# Patient Record
Sex: Female | Born: 1995 | Race: Black or African American | Hispanic: No | Marital: Single | Smoking: Never smoker
Health system: Southern US, Community
[De-identification: ages and names within clinical notes are randomized; demographics above are authoritative.]

## PROBLEM LIST (undated history)

## (undated) DIAGNOSIS — F32A Depression, unspecified: Secondary | ICD-10-CM

## (undated) DIAGNOSIS — F419 Anxiety disorder, unspecified: Secondary | ICD-10-CM

## (undated) DIAGNOSIS — G709 Myoneural disorder, unspecified: Secondary | ICD-10-CM

## (undated) DIAGNOSIS — F329 Major depressive disorder, single episode, unspecified: Secondary | ICD-10-CM

## (undated) HISTORY — DX: Anxiety disorder, unspecified: F41.9

## (undated) HISTORY — PX: WISDOM TOOTH EXTRACTION: SHX21

## (undated) HISTORY — DX: Myoneural disorder, unspecified: G70.9

## (undated) HISTORY — DX: Depression, unspecified: F32.A

## (undated) HISTORY — DX: Major depressive disorder, single episode, unspecified: F32.9

---
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
## 2017-10-01 ENCOUNTER — Encounter: Payer: Self-pay | Admitting: Nurse Practitioner

## 2018-07-29 ENCOUNTER — Encounter: Payer: Self-pay | Admitting: Nurse Practitioner

## 2018-07-29 ENCOUNTER — Ambulatory Visit: Payer: BLUE CROSS/BLUE SHIELD | Admitting: Nurse Practitioner

## 2018-07-29 VITALS — BP 110/78 | HR 86 | Temp 99.1°F | Ht 63.0 in | Wt 175.0 lb

## 2018-07-29 DIAGNOSIS — F332 Major depressive disorder, recurrent severe without psychotic features: Secondary | ICD-10-CM

## 2018-07-29 DIAGNOSIS — F419 Anxiety disorder, unspecified: Secondary | ICD-10-CM

## 2018-07-29 MED ORDER — FLUOXETINE HCL 20 MG PO TABS: 20.0000 mg | ORAL_TABLET | Freq: Every day | ORAL | 0 refills | Status: DC

## 2018-07-29 NOTE — Patient Instructions (Addendum)
Stop escitalopram. Start prozac or fluoxetine today.  Fluoxetine capsules or tablets (Depression/Mood Disorders) What is this medicine? FLUOXETINE (floo OX e teen) belongs to a class of drugs known as selective serotonin reuptake inhibitors (SSRIs). It helps to treat mood problems such as depression, obsessive compulsive disorder, and panic attacks. It can also treat certain eating disorders. This medicine may be used for other purposes; ask your health care provider or pharmacist if you have questions. COMMON BRAND NAME(S): Prozac What should I tell my health care provider before I take this medicine? They need to know if you have any of these conditions: -bipolar disorder or a family history of bipolar disorder -bleeding disorders -glaucoma -heart disease -liver disease -low levels of sodium in the blood -seizures -suicidal thoughts, plans, or attempt; a previous suicide attempt by you or a family member -take MAOIs like Carbex, Eldepryl, Marplan, Nardil, and Parnate -take medicines that treat or prevent blood clots -thyroid disease -an unusual or allergic reaction to fluoxetine, other medicines, foods, dyes, or preservatives -pregnant or trying to get pregnant -breast-feeding How should I use this medicine? Take this medicine by mouth with a glass of water. Follow the directions on the prescription label. You can take this medicine with or without food. Take your medicine at regular intervals. Do not take it more often than directed. Do not stop taking this medicine suddenly except upon the advice of your doctor. Stopping this medicine too quickly may cause serious side effects or your condition may worsen. A special MedGuide will be given to you by the pharmacist with each prescription and refill. Be sure to read this information carefully each time. Talk to your pediatrician regarding the use of this medicine in children. While this drug may be prescribed for children as young as 7  years for selected conditions, precautions do apply. Overdosage: If you think you have taken too much of this medicine contact a poison control center or emergency room at once. NOTE: This medicine is only for you. Do not share this medicine with others. What if I miss a dose? If you miss a dose, skip the missed dose and go back to your regular dosing schedule. Do not take double or extra doses. What may interact with this medicine? Do not take this medicine with any of the following medications: -other medicines containing fluoxetine, like Sarafem or Symbyax -cisapride -dronedarone -linezolid -MAOIs like Carbex, Eldepryl, Marplan, Nardil, and Parnate -methylene blue (injected into a vein) -pimozide -thioridazine This medicine may also interact with the following medications: -alcohol -amphetamines -aspirin and aspirin-like medicines -carbamazepine -certain medicines for depression, anxiety, or psychotic disturbances -certain medicines for migraine headaches like almotriptan, eletriptan, frovatriptan, naratriptan, rizatriptan, sumatriptan, zolmitriptan -digoxin -diuretics -fentanyl -flecainide -furazolidone -isoniazid -lithium -medicines for sleep -medicines that treat or prevent blood clots like warfarin, enoxaparin, and dalteparin -NSAIDs, medicines for pain and inflammation, like ibuprofen or naproxen -other medicines that prolong the QT interval (an abnormal heart rhythm) -phenytoin -procarbazine -propafenone -rasagiline -ritonavir -supplements like St. John's wort, kava kava, valerian -tramadol -tryptophan -vinblastine This list may not describe all possible interactions. Give your health care provider a list of all the medicines, herbs, non-prescription drugs, or dietary supplements you use. Also tell them if you smoke, drink alcohol, or use illegal drugs. Some items may interact with your medicine. What should I watch for while using this medicine? Tell your doctor  if your symptoms do not get better or if they get worse. Visit your doctor or health care professional for  regular checks on your progress. Because it may take several weeks to see the full effects of this medicine, it is important to continue your treatment as prescribed by your doctor. Patients and their families should watch out for new or worsening thoughts of suicide or depression. Also watch out for sudden changes in feelings such as feeling anxious, agitated, panicky, irritable, hostile, aggressive, impulsive, severely restless, overly excited and hyperactive, or not being able to sleep. If this happens, especially at the beginning of treatment or after a change in dose, call your health care professional. Bonita Quin may get drowsy or dizzy. Do not drive, use machinery, or do anything that needs mental alertness until you know how this medicine affects you. Do not stand or sit up quickly, especially if you are an older patient. This reduces the risk of dizzy or fainting spells. Alcohol may interfere with the effect of this medicine. Avoid alcoholic drinks. Your mouth may get dry. Chewing sugarless gum or sucking hard candy, and drinking plenty of water may help. Contact your doctor if the problem does not go away or is severe. This medicine may affect blood sugar levels. If you have diabetes, check with your doctor or health care professional before you change your diet or the dose of your diabetic medicine. What side effects may I notice from receiving this medicine? Side effects that you should report to your doctor or health care professional as soon as possible: -allergic reactions like skin rash, itching or hives, swelling of the face, lips, or tongue -anxious -black, tarry stools -breathing problems -changes in vision -confusion -elevated mood, decreased need for sleep, racing thoughts, impulsive behavior -eye pain -fast, irregular heartbeat -feeling faint or lightheaded, falls -feeling  agitated, angry, or irritable -hallucination, loss of contact with reality -loss of balance or coordination -loss of memory -painful or prolonged erections -restlessness, pacing, inability to keep still -seizures -stiff muscles -suicidal thoughts or other mood changes -trouble sleeping -unusual bleeding or bruising -unusually weak or tired -vomiting Side effects that usually do not require medical attention (report to your doctor or health care professional if they continue or are bothersome): -change in appetite or weight -change in sex drive or performance -diarrhea -dry mouth -headache -increased sweating -nausea -tremors This list may not describe all possible side effects. Call your doctor for medical advice about side effects. You may report side effects to FDA at 1-800-FDA-1088. Where should I keep my medicine? Keep out of the reach of children. Store at room temperature between 15 and 30 degrees C (59 and 86 degrees F). Throw away any unused medicine after the expiration date. NOTE: This sheet is a summary. It may not cover all possible information. If you have questions about this medicine, talk to your doctor, pharmacist, or health care provider.  2019 Elsevier/Gold Standard (2018-01-09 11:56:53)

## 2018-07-29 NOTE — Progress Notes (Signed)
Subjective:  Patient ID: Kristin Spencer, female    DOB: 04-22-1996  Age: 23 y.o. MRN: [id number]  CC: Establish Care (est care/medication consul: escitalopram )   HPI  DepoProvera Injection. Last injection 06/2018 by [facility]. sexually active, female partner, use of condoms.   Anxiety and depression: Diagnosed 33yrs ago Counseling in past. use of escitalopram x 37yrs. Worsening irritability and sadness in last 3weeks.  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  Depression screen [facility] 2/9 07/29/2018 07/29/2018  Decreased Interest 2 2  Down, Depressed, Hopeless 2 2  PHQ - 2 Score 4 4  Altered sleeping 2 -  Tired, decreased energy 3 -  Change in appetite 2 -  Feeling bad or failure about yourself  1 -  Trouble concentrating 1 -  Moving slowly or fidgety/restless 1 -  Suicidal thoughts 0 -  PHQ-9 Score 14 -   GAD 7 : Generalized Anxiety Score 07/29/2018  Nervous, Anxious, on Edge 3  Control/stop worrying 2  Worry too much - different things 3  Trouble relaxing 2  Restless 1  Easily annoyed or irritable 3  Afraid - awful might happen 1  Total GAD 7 Score 15    Reviewed past Medical, Social and Family history today.  Outpatient Medications Prior to Visit  Medication Sig Dispense Refill  . Cetirizine HCl (ZYRTEC PO) Take by mouth.    . escitalopram (LEXAPRO) 20 MG tablet     . medroxyPROGESTERone (DEPO-PROVERA) 150 MG/ML injection Inject 1 mL (150 mg total) into the muscle once for 1 dose. 1 mL    No facility-administered medications prior to visit.     ROS See HPI  Objective:  BP 110/78   Pulse 86   Temp 99.1 F (37.3 C) (Oral)   Ht 5\' 3"  (1.6 m)   Wt 175 lb (79.4 kg)   SpO2 98%   BMI 31.00 kg/m   BP Readings from Last 3 Encounters:  07/29/18 110/78    Wt Readings from Last 3 Encounters:  07/29/18 175 lb (79.4 kg)    Physical Exam Cardiovascular:     Rate and Rhythm: Normal rate and regular rhythm.   Heart sounds: Normal heart sounds.  Pulmonary:     Effort: Pulmonary effort is normal.  Neurological:     Mental Status: She is alert and oriented to person, place, and time.  Psychiatric:        Mood and Affect: Mood normal.        Behavior: Behavior normal.        Thought Content: Thought content normal.     No results found for: WBC, HGB, HCT, PLT, GLUCOSE, CHOL, TRIG, HDL, LDLDIRECT, LDLCALC, ALT, AST, NA, K, CL, CREATININE, BUN, CO2, TSH, PSA, INR, GLUF, HGBA1C, MICROALBUR  No results found.  Assessment & Plan:   Yaritzy was seen today for establish care.  Diagnoses and all orders for this visit:  Severe episode of recurrent major depressive disorder, without psychotic features (HCC) -     FLUoxetine (PROZAC) 20 MG tablet; Take 1 tablet (20 mg total) by mouth daily.  Anxiety -     FLUoxetine (PROZAC) 20 MG tablet; Take 1 tablet (20 mg total) by mouth daily.   I have discontinued Lyan R. Roach's escitalopram. I am also having her start on FLUoxetine. Additionally, I am having her maintain her Cetirizine HCl (ZYRTEC PO) and medroxyPROGESTERone.  Meds ordered this encounter  Medications  . FLUoxetine (PROZAC) 20 MG tablet  Sig: Take 1 tablet (20 mg total) by mouth daily.    Dispense:  30 tablet    Refill:  0    Order Specific Question:   Supervising Provider    Answer:   MATTHEWS, CODY [[id number]]    Problem List Items Addressed This Visit    None    Visit Diagnoses    Severe episode of recurrent major depressive disorder, without psychotic features (HCC)    -  Primary   Relevant Medications   FLUoxetine (PROZAC) 20 MG tablet   Anxiety       Relevant Medications   FLUoxetine (PROZAC) 20 MG tablet       Follow-up: Return in about 2 weeks (around 08/12/2018) for CPE(fasting, PAP, breast) and depression f/up.  Kristin Penna, NP

## 2018-07-31 ENCOUNTER — Encounter: Payer: Self-pay | Admitting: Nurse Practitioner

## 2018-08-11 ENCOUNTER — Encounter: Payer: BLUE CROSS/BLUE SHIELD | Admitting: Nurse Practitioner

## 2018-08-29 ENCOUNTER — Encounter: Payer: Self-pay | Admitting: Nurse Practitioner

## 2018-08-29 ENCOUNTER — Other Ambulatory Visit: Payer: Self-pay

## 2018-08-29 ENCOUNTER — Telehealth: Payer: Self-pay | Admitting: Nurse Practitioner

## 2018-08-29 ENCOUNTER — Ambulatory Visit (INDEPENDENT_AMBULATORY_CARE_PROVIDER_SITE_OTHER): Payer: BLUE CROSS/BLUE SHIELD | Admitting: Nurse Practitioner

## 2018-08-29 VITALS — Ht 63.0 in | Wt 173.4 lb

## 2018-08-29 DIAGNOSIS — F332 Major depressive disorder, recurrent severe without psychotic features: Secondary | ICD-10-CM | POA: Diagnosis not present

## 2018-08-29 DIAGNOSIS — F419 Anxiety disorder, unspecified: Secondary | ICD-10-CM

## 2018-08-29 MED ORDER — FLUOXETINE HCL 20 MG PO TABS: 20.0000 mg | ORAL_TABLET | Freq: Every day | ORAL | 1 refills | Status: DC

## 2018-08-29 NOTE — Telephone Encounter (Signed)
I called and spoke with patient, patient will call back to schedule her 3 month follow up appointment for 30 minutes in June. 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

## 2018-08-29 NOTE — Progress Notes (Signed)
Virtual Visit via Telephone Note  I connected with Kristin Spencer on 08/29/18 at  8:15 AM EDT by telephone and verified that I am speaking with the correct person using two identifiers.   I discussed the limitations, risks, security and privacy concerns of performing an evaluation and management service by telephone and the availability of in person appointments. I also discussed with the patient that there may be a patient responsible charge related to this service. The patient expressed understanding and agreed to proceed.  History of Present Illness: Anxiety and Depression: Reports improved irritability, motivation, appetite, energy, and sleep. No SI or HI Denies any adverse effects with fluoxetine.  Contraception Counsel: Use of Depoprovera injection, managed by [facility] department, last administered 06/2018, next injection 09/2018 per patient. She reports intermittent vaginal bleeding, no ABD pain, no clots. She is concerned because bleeding is getting worse. No dizziness, no palpitations, no chest pain, no picca.   Reviewed medication list with patient.  Observations/Objective: Wt Readings from Last 3 Encounters:  08/29/18 173 lb 6.4 oz (78.7 kg)  07/29/18 175 lb (79.4 kg)   Unable to provide any other vital signs.  Alert and orientedx 4, normal speech tone, does not sound in distress.  Assessment and Plan: Kristin Spencer was seen today for follow-up.  Diagnoses and all orders for this visit:  Severe episode of recurrent major depressive disorder, without psychotic features (HCC) -     FLUoxetine (PROZAC) 20 MG tablet; Take 1 tablet (20 mg total) by mouth daily.  Anxiety -     FLUoxetine (PROZAC) 20 MG tablet; Take 1 tablet (20 mg total) by mouth daily.    Follow Up Instructions: F/up in 51months. Please discuss possible use of estrogen with [facility].   I discussed the assessment and treatment plan with the patient. The patient was provided an  opportunity to ask questions and all were answered. The patient agreed with the plan and demonstrated an understanding of the instructions.   The patient was advised to call back or seek an in-person evaluation if the symptoms worsen or if the condition fails to improve as anticipated.  I provided 15 minutes of non-face-to-face time during this encounter.   Alysia Penna, NP

## 2018-08-29 NOTE — Addendum Note (Signed)
Addended by: Alysia Penna L on: 08/29/2018 03:40 PM   Modules accepted: Level of Service

## 2018-12-31 ENCOUNTER — Other Ambulatory Visit: Payer: Self-pay

## 2018-12-31 ENCOUNTER — Other Ambulatory Visit (HOSPITAL_COMMUNITY)
Admission: RE | Admit: 2018-12-31 | Discharge: 2018-12-31 | Disposition: A | Discharge status: Home / Self Care | Payer: BC Managed Care – PPO | Source: Ambulatory Visit | Attending: Nurse Practitioner | Admitting: Nurse Practitioner

## 2018-12-31 ENCOUNTER — Encounter: Payer: Self-pay | Admitting: Nurse Practitioner

## 2018-12-31 ENCOUNTER — Ambulatory Visit (INDEPENDENT_AMBULATORY_CARE_PROVIDER_SITE_OTHER): Payer: BC Managed Care – PPO | Admitting: Nurse Practitioner

## 2018-12-31 VITALS — BP 122/80 | HR 79 | Temp 99.3°F | Ht 63.0 in | Wt 184.0 lb

## 2018-12-31 DIAGNOSIS — N760 Acute vaginitis: Secondary | ICD-10-CM | POA: Diagnosis not present

## 2018-12-31 DIAGNOSIS — Z124 Encounter for screening for malignant neoplasm of cervix: Secondary | ICD-10-CM | POA: Insufficient documentation

## 2018-12-31 DIAGNOSIS — Z0000 Encounter for general adult medical examination without abnormal findings: Secondary | ICD-10-CM | POA: Diagnosis present

## 2018-12-31 DIAGNOSIS — Z0001 Encounter for general adult medical examination with abnormal findings: Secondary | ICD-10-CM

## 2018-12-31 DIAGNOSIS — F324 Major depressive disorder, single episode, in partial remission: Secondary | ICD-10-CM | POA: Diagnosis not present

## 2018-12-31 DIAGNOSIS — Z3042 Encounter for surveillance of injectable contraceptive: Secondary | ICD-10-CM | POA: Diagnosis not present

## 2018-12-31 DIAGNOSIS — Z113 Encounter for screening for infections with a predominantly sexual mode of transmission: Secondary | ICD-10-CM

## 2018-12-31 DIAGNOSIS — F411 Generalized anxiety disorder: Secondary | ICD-10-CM | POA: Diagnosis not present

## 2018-12-31 DIAGNOSIS — B9689 Other specified bacterial agents as the cause of diseases classified elsewhere: Secondary | ICD-10-CM

## 2018-12-31 LAB — COMPREHENSIVE METABOLIC PANEL
ALT: 24 U/L (ref 0–35)
AST: 19 U/L (ref 0–37)
Albumin: 4.6 g/dL (ref 3.5–5.2)
Alkaline Phosphatase: 92 U/L (ref 39–117)
BUN: 9 mg/dL (ref 6–23)
CO2: 22 mEq/L (ref 19–32)
Calcium: 9.6 mg/dL (ref 8.4–10.5)
Chloride: 108 mEq/L (ref 96–112)
Creatinine, Ser: 0.69 mg/dL (ref 0.40–1.20)
GFR: 129.13 mL/min (ref >60.00)
Glucose, Bld: 100 mg/dL — ABNORMAL HIGH (ref 70–99)
Potassium: 3.9 mEq/L (ref 3.5–5.1)
Sodium: 139 mEq/L (ref 135–145)
Total Bilirubin: 0.3 mg/dL (ref 0.2–1.2)
Total Protein: 7.4 g/dL (ref 6.0–8.3)

## 2018-12-31 LAB — LIPID PANEL
Cholesterol: 135 mg/dL (ref 0–200)
HDL: 39 mg/dL — ABNORMAL LOW (ref >39.00)
LDL Cholesterol: 83 mg/dL (ref 0–99)
NonHDL: 96.45
Total CHOL/HDL Ratio: 3
Triglycerides: 67 mg/dL (ref 0.0–149.0)
VLDL: 13.4 mg/dL (ref 0.0–40.0)

## 2018-12-31 LAB — CBC
HCT: 39.2 % (ref 36.0–46.0)
Hemoglobin: 13 g/dL (ref 12.0–15.0)
MCHC: 33.1 g/dL (ref 30.0–36.0)
MCV: 76.9 fl — ABNORMAL LOW (ref 78.0–100.0)
Platelets: 296 10*3/uL (ref 150.0–400.0)
RBC: 5.1 Mil/uL (ref 3.87–5.11)
RDW: 15.4 % (ref 11.5–15.5)
WBC: 4.4 10*3/uL (ref 4.0–10.5)

## 2018-12-31 LAB — TSH: TSH: 0.95 u[IU]/mL (ref 0.35–4.50)

## 2018-12-31 MED ORDER — FLUOXETINE HCL 20 MG PO TABS: 20.0000 mg | ORAL_TABLET | Freq: Every day | ORAL | 3 refills | Status: DC

## 2018-12-31 NOTE — Progress Notes (Addendum)
Subjective:    Patient ID: Kristin Spencer, female    DOB: 1995-07-21, 23 y.o.   MRN: [id number]  Patient presents today for complete physical and discuss continuation of depoprovera injection and eval of anxiety  HPI  Contraception management: [facility] She is due to next injection 01/09/2019.   Sexual History (orientation,birth control, marital status, STD):not currently sexually active, heterosexual.  Depression/Suicide:improved with prozac. Will like referral [facility]BT [facility] 12/31/2018 12/31/2018 07/29/2018 07/29/2018  Decreased Interest 1 2 2 2   Down, Depressed, Hopeless 1 1 2 2   PHQ - 2 Score 2 3 4 4   Altered sleeping 0 1 2 -  Tired, decreased energy 1 0 3 -  Change in appetite 1 0 2 -  Feeling bad or failure about yourself  0 - 1 -  Trouble concentrating 1 - 1 -  Moving slowly or fidgety/restless 0 - 1 -  Suicidal thoughts 0 - 0 -  PHQ-9 Score 5 4 14  -   Vision:up to date, use of corrective lens  Dental:up to date, use of corrective lens  Immunizations: (TDAP, Hep C screen, Pneumovax, Influenza, zoster)  Health Maintenance  Topic Date Due  . Tetanus Vaccine  04/30/2016  . Flu Shot  01/03/2019  . PAP-Cervical Cytology Screening  12/30/2021  . Pap Smear  12/30/2021  . HIV Screening  Completed   Diet:regular.  Weight:  Wt Readings from Last 3 Encounters:  12/31/18 184 lb (83.5 kg)  08/29/18 173 lb 6.4 oz (78.7 kg)  07/29/18 175 lb (79.4 kg)   Exercise:none  Fall Risk: Fall Risk  07/29/2018  Falls in the past year? 0   Medications and allergies reviewed with patient and updated if appropriate.  Patient Active Problem List   Diagnosis Date Noted  . Encounter for surveillance of injectable contraceptive 12/31/2018  . Generalized anxiety disorder 11/18/2015  . Depression, major, single episode, in partial remission (HCC) 11/18/2015  . Carpal tunnel syndrome of left  wrist 10/09/2015    Current Outpatient Medications on File Prior to Visit  Medication Sig Dispense Refill  . Cetirizine HCl (ZYRTEC PO) Take by mouth.    . medroxyPROGESTERone (DEPO-PROVERA) 150 MG/ML injection Inject 1 mL (150 mg total) into the muscle once for 1 dose. 1 mL    No current facility-administered medications on file prior to visit.     Past Medical History:  Diagnosis Date  . Anxiety   . Depression     Past Surgical History:  Procedure Laterality Date  . WISDOM TOOTH EXTRACTION      Social History   Socioeconomic History  . Marital status: Single    Spouse name: Not on file  . Number of children: Not on file  . Years of education: Not on file  . Highest education level: Not on file  Occupational History  . Not on file  Social Needs  . Financial resource strain: Not on file  . Food insecurity    Worry: Not on file    Inability: Not on file  . Transportation needs    Medical: Not on file    Non-medical: Not on file  Tobacco Use  . Smoking status: Never Smoker  . Smokeless tobacco: Never Used  Substance and Sexual Activity  . Alcohol use: Never    Frequency: Never  . Drug use: Never  . Sexual activity: Yes    Birth control/protection: Injection  Lifestyle  . Physical activity  Days per week: Not on file    Minutes per session: Not on file  . Stress: Not on file  Relationships  . Social Musicianconnections    Talks on phone: Not on file    Gets together: Not on file    Attends religious service: Not on file    Active member of club or organization: Not on file    Attends meetings of clubs or organizations: Not on file    Relationship status: Not on file  Other Topics Concern  . Not on file  Social History Narrative  . Not on file    Family History  Problem Relation Age of Onset  . Diabetes Father   . Diabetes Paternal Uncle   . Diabetes Maternal Grandfather   . Diabetes Paternal Grandfather   . Heart disease Paternal Grandfather   . Heart  attack Paternal Grandfather   . Diabetes Paternal Uncle         Review of Systems  Constitutional: Negative for fever, malaise/fatigue and weight loss.  HENT: Negative for congestion and sore throat.   Eyes:       Negative for visual changes  Respiratory: Negative for cough and shortness of breath.   Cardiovascular: Negative for chest pain, palpitations and leg swelling.  Gastrointestinal: Negative for blood in stool, constipation, diarrhea and heartburn.  Genitourinary: Negative for dysuria, frequency and urgency.  Musculoskeletal: Negative for falls, joint pain and myalgias.  Skin: Negative for rash.  Neurological: Negative for dizziness, sensory change and headaches.  Endo/Heme/Allergies: Does not bruise/bleed easily.  Psychiatric/Behavioral: Negative for depression, substance abuse and suicidal ideas. The patient is not nervous/anxious.     Objective:   Vitals:   12/31/18 0917  BP: 122/80  Pulse: 79  Temp: 99.3 F (37.4 C)  SpO2: 99%    Body mass index is 32.59 kg/m.   Physical Examination:  Physical Exam Vitals signs reviewed. Exam conducted with a chaperone present.  Constitutional:      General: She is not in acute distress.    Appearance: She is well-developed.  HENT:     Head: Normocephalic.     Right Ear: Tympanic membrane, ear canal and external ear normal.     Left Ear: Tympanic membrane, ear canal and external ear normal.     Nose: Nose normal.  Eyes:     Extraocular Movements: Extraocular movements intact.     Conjunctiva/sclera: Conjunctivae normal.  Neck:     Musculoskeletal: Normal range of motion and neck supple.  Cardiovascular:     Rate and Rhythm: Normal rate and regular rhythm.     Heart sounds: Normal heart sounds.  Pulmonary:     Effort: Pulmonary effort is normal. No respiratory distress.     Breath sounds: Normal breath sounds.  Chest:     Chest wall: No tenderness.     Breasts:        Right: Normal.        Left: Normal.   Abdominal:     General: Bowel sounds are normal.     Palpations: Abdomen is soft.  Genitourinary:    General: Normal vulva.     Pubic Area: No rash.      Labia:        Right: No rash or tenderness.        Left: No rash or tenderness.      Vagina: Normal. No vaginal discharge, erythema, tenderness or bleeding.     Cervix: Friability present. No cervical motion tenderness.  Uterus: Not tender.      Adnexa: Right adnexa normal and left adnexa normal.       Right: No tenderness.         Left: No tenderness.       Rectum: Normal.     Comments: Visible transformation zone Musculoskeletal: Normal range of motion.  Lymphadenopathy:     Cervical: No cervical adenopathy.     Upper Body:     Right upper body: No supraclavicular, axillary or pectoral adenopathy.     Left upper body: No supraclavicular, axillary or pectoral adenopathy.     Lower Body: No right inguinal adenopathy. No left inguinal adenopathy.  Skin:    General: Skin is warm and dry.     Findings: No rash.  Neurological:     Mental Status: She is alert and oriented to person, place, and time.     Deep Tendon Reflexes: Reflexes are normal and symmetric.  Psychiatric:        Mood and Affect: Mood normal.        Behavior: Behavior normal.        Thought Content: Thought content normal.    ASSESSMENT and PLAN:  Shaunta was seen today for annual exam.  Diagnoses and all orders for this visit:  Encounter for preventative adult health care exam with abnormal findings -     CBC -     Comprehensive metabolic panel -     TSH -     Lipid panel -     Cytology - PAP( [place])  Encounter for Papanicolaou smear for cervical cancer screening -     Cytology - PAP( [place])  Screen for STD (sexually transmitted disease) -     HIV antibody (with reflex) -     Cervicovaginal ancillary only( [place])  Generalized anxiety disorder -     Ambulatory referral to Psychology -     FLUoxetine (PROZAC) 20 MG tablet; Take 1  tablet (20 mg total) by mouth daily.  Depression, major, single episode, in partial remission (HCC) -     Ambulatory referral to Psychology -     FLUoxetine (PROZAC) 20 MG tablet; Take 1 tablet (20 mg total) by mouth daily.  Encounter for surveillance of injectable contraceptive  BV (bacterial vaginosis) -     metroNIDAZOLE (METROGEL VAGINAL) 0.75 % vaginal gel; Place 1 Applicatorful vaginally at bedtime for 5 days.    Encounter for surveillance of injectable contraceptive [facility]. Next injection due 01/09/2019 per patient.  Need to obtain documentation of last injection or perform urine pregnancy prior to next injection on 01/09/2019       Problem List Items Addressed This Visit      Other   Depression, major, single episode, in partial remission (HCC)   Relevant Medications   FLUoxetine (PROZAC) 20 MG tablet   Other Relevant Orders   Ambulatory referral to Psychology   Encounter for surveillance of injectable contraceptive    [facility] Next injection due 01/09/2019 per patient.  Need to obtain documentation of last injection or perform urine pregnancy prior to next injection on 01/09/2019       Generalized anxiety disorder   Relevant Medications   FLUoxetine (PROZAC) 20 MG tablet   Other Relevant Orders   Ambulatory referral to Psychology    Other Visit Diagnoses    Encounter for preventative adult health care exam with abnormal findings    -  Primary  Relevant Orders   CBC (Completed)   Comprehensive metabolic panel (Completed)   TSH (Completed)   Lipid panel (Completed)   Cytology - PAP( [place]) (Completed)   Encounter for Papanicolaou smear for cervical cancer screening       Relevant Orders   Cytology - PAP( [place]) (Completed)   Screen for STD (sexually transmitted disease)       Relevant Orders   HIV antibody (with reflex) (Completed)   Cervicovaginal ancillary only( [place])  (Completed)   BV (bacterial vaginosis)       Relevant Medications   metroNIDAZOLE (METROGEL VAGINAL) 0.75 % vaginal gel       Follow up: Return in about 6 months (around 07/03/2019) for anxiety and depression (complete PHQ and GAD).  Wilfred Lacy, NP

## 2018-12-31 NOTE — Assessment & Plan Note (Addendum)
Initiated by [place]. Next injection due 01/09/2019 per patient.  Need to obtain documentation of last injection or perform urine pregnancy prior to next injection on 01/09/2019

## 2018-12-31 NOTE — Patient Instructions (Addendum)
Go to lab for blood draw.  Maintain heart healthy diet and regular exercise.  You will be contacted to schedule appt with psychology.  Health Maintenance, Female Adopting a healthy lifestyle and getting preventive care are important in promoting health and wellness. Ask your health care provider about:  The right schedule for you to have regular tests and exams.  Things you can do on your own to prevent diseases and keep yourself healthy. What should I know about diet, weight, and exercise? Eat a healthy diet   Eat a diet that includes plenty of vegetables, fruits, low-fat dairy products, and lean protein.  Do not eat a lot of foods that are high in solid fats, added sugars, or sodium. Maintain a healthy weight Body mass index (BMI) is used to identify weight problems. It estimates body fat based on height and weight. Your health care provider can help determine your BMI and help you achieve or maintain a healthy weight. Get regular exercise Get regular exercise. This is one of the most important things you can do for your health. Most adults should:  Exercise for at least 150 minutes each week. The exercise should increase your heart rate and make you sweat (moderate-intensity exercise).  Do strengthening exercises at least twice a week. This is in addition to the moderate-intensity exercise.  Spend less time sitting. Even light physical activity can be beneficial. Watch cholesterol and blood lipids Have your blood tested for lipids and cholesterol at 22 years of age, then have this test every 5 years. Have your cholesterol levels checked more often if:  Your lipid or cholesterol levels are high.  You are older than 22 years of age.  You are at high risk for heart disease. What should I know about cancer screening? Depending on your health history and family history, you may need to have cancer screening at various ages. This may include screening for:  Breast cancer.   Cervical cancer.  Colorectal cancer.  Skin cancer.  Lung cancer. What should I know about heart disease, diabetes, and high blood pressure? Blood pressure and heart disease  High blood pressure causes heart disease and increases the risk of stroke. This is more likely to develop in people who have high blood pressure readings, are of African descent, or are overweight.  Have your blood pressure checked: ? Every 3-5 years if you are 9318-22 years of age. ? Every year if you are 22 years old or older. Diabetes Have regular diabetes screenings. This checks your fasting blood sugar level. Have the screening done:  Once every three years after age 22 if you are at a normal weight and have a low risk for diabetes.  More often and at a younger age if you are overweight or have a high risk for diabetes. What should I know about preventing infection? Hepatitis B If you have a higher risk for hepatitis B, you should be screened for this virus. Talk with your health care provider to find out if you are at risk for hepatitis B infection. Hepatitis C Testing is recommended for:  Everyone born from [birth year]1945 through 1965.  Anyone with known risk factors for hepatitis C. Sexually transmitted infections (STIs)  Get screened for STIs, including gonorrhea and chlamydia, if: ? You are sexually active and are younger than 22 years of age. ? You are older than 22 years of age and your health care provider tells you that you are at risk for this type of infection. ? Your  sexual activity has changed since you were last screened, and you are at increased risk for chlamydia or gonorrhea. Ask your health care provider if you are at risk.  Ask your health care provider about whether you are at high risk for HIV. Your health care provider may recommend a prescription medicine to help prevent HIV infection. If you choose to take medicine to prevent HIV, you should first get tested for HIV. You should then be tested  every 3 months for as long as you are taking the medicine. Pregnancy  If you are about to stop having your period (premenopausal) and you may become pregnant, seek counseling before you get pregnant.  Take 400 to 800 micrograms (mcg) of folic acid every day if you become pregnant.  Ask for birth control (contraception) if you want to prevent pregnancy. Osteoporosis and menopause Osteoporosis is a disease in which the bones lose minerals and strength with aging. This can result in bone fractures. If you are 22 years old or older, or if you are at risk for osteoporosis and fractures, ask your health care provider if you should:  Be screened for bone loss.  Take a calcium or vitamin D supplement to lower your risk of fractures.  Be given hormone replacement therapy (HRT) to treat symptoms of menopause. Follow these instructions at home: Lifestyle  Do not use any products that contain nicotine or tobacco, such as cigarettes, e-cigarettes, and chewing tobacco. If you need help quitting, ask your health care provider.  Do not use street drugs.  Do not share needles.  Ask your health care provider for help if you need support or information about quitting drugs. Alcohol use  Do not drink alcohol if: ? Your health care provider tells you not to drink. ? You are pregnant, may be pregnant, or are planning to become pregnant.  If you drink alcohol: ? Limit how much you use to 0-1 drink a day. ? Limit intake if you are breastfeeding.  Be aware of how much alcohol is in your drink. In the [place] one drink equals one 12 oz bottle of beer (355 mL), one 5 oz glass of wine (148 mL), or one 1 oz glass of hard liquor (44 mL). General instructions  Schedule regular health, dental, and eye exams.  Stay current with your vaccines.  Tell your health care provider if: ? You often feel depressed. ? You have ever been abused or do not feel safe at home. Summary  Adopting a healthy lifestyle and  getting preventive care are important in promoting health and wellness.  Follow your health care provider's instructions about healthy diet, exercising, and getting tested or screened for diseases.  Follow your health care provider's instructions on monitoring your cholesterol and blood pressure. This information is not intended to replace advice given to you by your health care provider. Make sure you discuss any questions you have with your health care provider. Document Released: 12/04/2010 Document Revised: 05/14/2018 Document Reviewed: 05/14/2018 Elsevier Patient Education  2020 [employer]   DASH Eating Plan DASH stands for "Dietary Approaches to Stop Hypertension." The DASH eating plan is a healthy eating plan that has been shown to reduce high blood pressure (hypertension). It may also reduce your risk for type 2 diabetes, heart disease, and stroke. The DASH eating plan may also help with weight loss. What are tips for following this plan?  General guidelines  Avoid eating more than 2,300 mg (milligrams) of salt (sodium) a day. If you  have hypertension, you may need to reduce your sodium intake to 1,500 mg a day.  Limit alcohol intake to no more than 1 drink a day for nonpregnant women and 2 drinks a day for men. One drink equals 12 oz of beer, 5 oz of wine, or 1 oz of hard liquor.  Work with your health care provider to maintain a healthy body weight or to lose weight. Ask what an ideal weight is for you.  Get at least 30 minutes of exercise that causes your heart to beat faster (aerobic exercise) most days of the week. Activities may include walking, swimming, or biking.  Work with your health care provider or diet and nutrition specialist (dietitian) to adjust your eating plan to your individual calorie needs. Reading food labels   Check food labels for the amount of sodium per serving. Choose foods with less than 5 percent of the Daily Value of sodium. Generally, foods  with less than 300 mg of sodium per serving fit into this eating plan.  To find whole grains, look for the word "whole" as the first word in the ingredient list. Shopping  Buy products labeled as "low-sodium" or "no salt added."  Buy fresh foods. Avoid canned foods and premade or frozen meals. Cooking  Avoid adding salt when cooking. Use salt-free seasonings or herbs instead of table salt or sea salt. Check with your health care provider or pharmacist before using salt substitutes.  Do not fry foods. Cook foods using healthy methods such as baking, boiling, grilling, and broiling instead.  Cook with heart-healthy oils, such as olive, canola, soybean, or sunflower oil. Meal planning  Eat a balanced diet that includes: ? 5 or more servings of fruits and vegetables each day. At each meal, try to fill half of your plate with fruits and vegetables. ? Up to 6-8 servings of whole grains each day. ? Less than 6 oz of lean meat, poultry, or fish each day. A 3-oz serving of meat is about the same size as a deck of cards. One egg equals 1 oz. ? 2 servings of low-fat dairy each day. ? A serving of nuts, seeds, or beans 5 times each week. ? Heart-healthy fats. Healthy fats called Omega-3 fatty acids are found in foods such as flaxseeds and coldwater fish, like sardines, salmon, and mackerel.  Limit how much you eat of the following: ? Canned or prepackaged foods. ? Food that is high in trans fat, such as fried foods. ? Food that is high in saturated fat, such as fatty meat. ? Sweets, desserts, sugary drinks, and other foods with added sugar. ? Full-fat dairy products.  Do not salt foods before eating.  Try to eat at least 2 vegetarian meals each week.  Eat more home-cooked food and less restaurant, buffet, and fast food.  When eating at a restaurant, ask that your food be prepared with less salt or no salt, if possible. What foods are recommended? The items listed may not be a complete  list. Talk with your dietitian about what dietary choices are best for you. Grains Whole-grain or whole-wheat bread. Whole-grain or whole-wheat pasta. Brown rice. [place] Cobbatmeal. Quinoa. Bulgur. Whole-grain and low-sodium cereals. Pita bread. Low-fat, low-sodium crackers. Whole-wheat flour tortillas. Vegetables Fresh or frozen vegetables (raw, steamed, roasted, or grilled). Low-sodium or reduced-sodium tomato and vegetable juice. Low-sodium or reduced-sodium tomato sauce and tomato paste. Low-sodium or reduced-sodium canned vegetables. Fruits All fresh, dried, or frozen fruit. Canned fruit in natural juice (without added sugar).  Meat and other protein foods Skinless chicken or [place]. Ground chicken or [place]. Pork with fat trimmed off. Fish and seafood. Egg whites. Dried beans, peas, or lentils. Unsalted nuts, nut butters, and seeds. Unsalted canned beans. Lean cuts of beef with fat trimmed off. Low-sodium, lean deli meat. Dairy Low-fat (1%) or fat-free (skim) milk. Fat-free, low-fat, or reduced-fat cheeses. Nonfat, low-sodium ricotta or cottage cheese. Low-fat or nonfat yogurt. Low-fat, low-sodium cheese. Fats and oils Soft margarine without trans fats. Vegetable oil. Low-fat, reduced-fat, or light mayonnaise and salad dressings (reduced-sodium). Canola, safflower, olive, soybean, and sunflower oils. Avocado. Seasoning and other foods Herbs. Spices. Seasoning mixes without salt. Unsalted popcorn and pretzels. Fat-free sweets. What foods are not recommended? The items listed may not be a complete list. Talk with your dietitian about what dietary choices are best for you. Grains Baked goods made with fat, such as croissants, muffins, or some breads. Dry pasta or rice meal packs. Vegetables Creamed or fried vegetables. Vegetables in a cheese sauce. Regular canned vegetables (not low-sodium or reduced-sodium). Regular canned tomato sauce and paste (not low-sodium or reduced-sodium). Regular tomato and  vegetable juice (not low-sodium or reduced-sodium). Angie Fava. Olives. Fruits Canned fruit in a light or heavy syrup. Fried fruit. Fruit in cream or butter sauce. Meat and other protein foods Fatty cuts of meat. Ribs. Fried meat. Berniece Salines. Sausage. Bologna and other processed lunch meats. Salami. Fatback. Hotdogs. Bratwurst. Salted nuts and seeds. Canned beans with added salt. Canned or smoked fish. Whole eggs or egg yolks. Chicken or [place] with skin. Dairy Whole or 2% milk, cream, and half-and-half. Whole or full-fat cream cheese. Whole-fat or sweetened yogurt. Full-fat cheese. Nondairy creamers. Whipped toppings. Processed cheese and cheese spreads. Fats and oils Butter. Stick margarine. Lard. Shortening. Ghee. Bacon fat. Tropical oils, such as coconut, palm kernel, or palm oil. Seasoning and other foods Salted popcorn and pretzels. Onion salt, garlic salt, seasoned salt, table salt, and sea salt. Worcestershire sauce. Tartar sauce. Barbecue sauce. Teriyaki sauce. Soy sauce, including reduced-sodium. Steak sauce. Canned and packaged gravies. Fish sauce. Oyster sauce. Cocktail sauce. Horseradish that you find on the shelf. Ketchup. Mustard. Meat flavorings and tenderizers. Bouillon cubes. Hot sauce and Tabasco sauce. Premade or packaged marinades. Premade or packaged taco seasonings. Relishes. Regular salad dressings. Where to find more information:  National Heart, Lung, and [place]: https://wilson-eaton.com/  American Heart Association: www.heart.org Summary  The DASH eating plan is a healthy eating plan that has been shown to reduce high blood pressure (hypertension). It may also reduce your risk for type 2 diabetes, heart disease, and stroke.  With the DASH eating plan, you should limit salt (sodium) intake to 2,300 mg a day. If you have hypertension, you may need to reduce your sodium intake to 1,500 mg a day.  When on the DASH eating plan, aim to eat more fresh fruits and vegetables, whole  grains, lean proteins, low-fat dairy, and heart-healthy fats.  Work with your health care provider or diet and nutrition specialist (dietitian) to adjust your eating plan to your individual calorie needs. This information is not intended to replace advice given to you by your health care provider. Make sure you discuss any questions you have with your health care provider. Document Released: 05/10/2011 Document Revised: 05/03/2017 Document Reviewed: 05/14/2016 Elsevier Patient Education  2020 [employer].

## 2019-01-02 LAB — HIV ANTIBODY (ROUTINE TESTING W REFLEX): HIV 1&2 Ab, 4th Generation: NONREACTIVE

## 2019-01-02 NOTE — Progress Notes (Signed)
Called and schedule patient for depo shot on 01/06/2019 at 9:40am

## 2019-01-03 LAB — CYTOLOGY - PAP: Diagnosis: NEGATIVE

## 2019-01-06 ENCOUNTER — Ambulatory Visit: Payer: BC Managed Care – PPO

## 2019-01-07 LAB — CERVICOVAGINAL ANCILLARY ONLY
Bacterial vaginitis: POSITIVE — AB
Candida vaginitis: NEGATIVE
Chlamydia: NEGATIVE
Neisseria Gonorrhea: NEGATIVE
Trichomonas: NEGATIVE

## 2019-01-08 MED ORDER — METRONIDAZOLE 0.75 % VA GEL: 1.0000 | Freq: Every day | VAGINAL | 0 refills | Status: AC

## 2019-01-08 NOTE — Addendum Note (Signed)
Addended by: Wilfred Lacy L on: 01/08/2019 02:56 PM   Modules accepted: Orders

## 2019-01-09 ENCOUNTER — Telehealth: Payer: Self-pay | Admitting: Nurse Practitioner

## 2019-01-09 NOTE — Telephone Encounter (Signed)
Error

## 2019-01-13 ENCOUNTER — Ambulatory Visit (INDEPENDENT_AMBULATORY_CARE_PROVIDER_SITE_OTHER): Payer: BC Managed Care – PPO | Admitting: Behavioral Health

## 2019-01-13 DIAGNOSIS — Z309 Encounter for contraceptive management, unspecified: Secondary | ICD-10-CM | POA: Diagnosis not present

## 2019-01-13 LAB — POCT URINE PREGNANCY: Preg Test, Ur: NEGATIVE

## 2019-01-13 MED ORDER — MEDROXYPROGESTERONE ACETATE 150 MG/ML IM SUSP
150.0000 mg | Freq: Once | INTRAMUSCULAR | Status: AC
  Administered 2019-01-13: 150 mg via INTRAMUSCULAR

## 2019-01-13 NOTE — Progress Notes (Signed)
Patient presents in clinic today for Depo-Provera injection. Urine pregnancy test was completed; results were negative. IM injection was given in the right deltoid. Patient tolerated it well. No signs or symptoms of a reaction noted prior to patient leaving the nurse visit. Next appointment has been scheduled for 03/31/2019 at 10:00 AM.

## 2019-01-14 ENCOUNTER — Telehealth: Payer: Self-pay | Admitting: Nurse Practitioner

## 2019-01-14 NOTE — Progress Notes (Signed)
Medical screening examination/treatment/procedure(s) were performed by Medical Assistant. As primary care provider I was immediately available for consulation/collaboration. I agree with above documentation. Wilfred Lacy, AGNP-C

## 2019-01-14 NOTE — Telephone Encounter (Signed)
error 

## 2019-01-20 ENCOUNTER — Other Ambulatory Visit: Payer: Self-pay

## 2019-01-20 ENCOUNTER — Encounter: Payer: Self-pay | Admitting: Nurse Practitioner

## 2019-01-20 ENCOUNTER — Ambulatory Visit: Payer: BC Managed Care – PPO | Admitting: Nurse Practitioner

## 2019-01-20 VITALS — BP 118/82 | HR 75 | Temp 98.8°F | Ht 63.0 in | Wt 185.6 lb

## 2019-01-20 DIAGNOSIS — M778 Other enthesopathies, not elsewhere classified: Secondary | ICD-10-CM | POA: Diagnosis not present

## 2019-01-20 DIAGNOSIS — M222X2 Patellofemoral disorders, left knee: Secondary | ICD-10-CM

## 2019-01-20 MED ORDER — DICLOFENAC SODIUM 1 % TD GEL: 2.0000 g | Freq: Three times a day (TID) | TRANSDERMAL | 1 refills | Status: DC

## 2019-01-20 NOTE — Progress Notes (Signed)
Subjective:  Patient ID: Kristin Spencer, female    DOB: 06/25/1995  Age: 23 y.o. MRN: [id number]  CC: Knee Pain (pt is c/o of left wrist and knee painful---going for years--use ibuprofen otc/ in grown toe nails consult. )  Knee Pain  Incident onset: onset over 1year ago. There was no injury mechanism. The pain is present in the left knee. The quality of the pain is described as aching and burning. Pertinent negatives include no inability to bear weight, loss of motion, loss of sensation, muscle weakness, numbness or tingling. She reports no foreign bodies present. The symptoms are aggravated by weight bearing and movement. She has tried acetaminophen, NSAIDs and immobilization for the symptoms. The treatment provided mild relief.  Wrist Pain  The pain is present in the left wrist. This is a chronic problem. The current episode started more than 1 year ago. There has been no history of extremity trauma. The problem occurs constantly. The problem has been unchanged. The quality of the pain is described as aching. Associated symptoms include stiffness. Pertinent negatives include no fever, inability to bear weight, itching, joint locking, joint swelling, numbness or tingling. The symptoms are aggravated by activity. She has tried acetaminophen for the symptoms. The treatment provided mild relief. Family history does not include gout or rheumatoid arthritis. There is no history of diabetes, gout, osteoarthritis or rheumatoid arthritis.  onset of wrist pain when playing violet in [place]. She is right hand dominant.  Reviewed past Medical, Social and Family history today.  Outpatient Medications Prior to Visit  Medication Sig Dispense Refill  . Cetirizine HCl (ZYRTEC PO) Take by mouth.    Marland Kitchen FLUoxetine (PROZAC) 20 MG tablet Take 1 tablet (20 mg total) by mouth daily. 90 tablet 3  . medroxyPROGESTERone (DEPO-PROVERA) 150 MG/ML injection Inject 1 mL (150 mg total) into the muscle once for 1 dose. 1  mL    No facility-administered medications prior to visit.    ROS See HPI  Objective:  BP 118/82   Pulse 75   Temp 98.8 F (37.1 C) (Oral)   Ht 5\' 3"  (1.6 m)   Wt 185 lb 9.6 oz (84.2 kg)   SpO2 99%   BMI 32.88 kg/m   BP Readings from Last 3 Encounters:  01/20/19 118/82  12/31/18 122/80  07/29/18 110/78    Wt Readings from Last 3 Encounters:  01/20/19 185 lb 9.6 oz (84.2 kg)  12/31/18 184 lb (83.5 kg)  08/29/18 173 lb 6.4 oz (78.7 kg)   Physical Exam Vitals signs reviewed.  Neck:     Musculoskeletal: Normal range of motion and neck supple.  Musculoskeletal:        General: Tenderness present. No swelling or deformity.     Right hip: Normal.     Left hip: Normal.     Right knee: Normal.     Left knee: She exhibits abnormal patellar mobility. She exhibits normal range of motion, no swelling, no effusion and no erythema. Tenderness found. Patellar tendon tenderness noted.     Right ankle: Normal.     Left ankle: Normal.     Right upper leg: Normal.     Left upper leg: Normal.     Right lower leg: No edema.     Left lower leg: No edema.  Skin:    Findings: No erythema or rash.  Neurological:     Mental Status: She is alert.  Psychiatric:        Mood and Affect: Mood  normal.        Behavior: Behavior normal.    Lab Results  Component Value Date   WBC 4.4 12/31/2018   HGB 13.0 12/31/2018   HCT 39.2 12/31/2018   PLT 296.0 12/31/2018   GLUCOSE 100 (H) 12/31/2018   CHOL 135 12/31/2018   TRIG 67.0 12/31/2018   HDL 39.00 (L) 12/31/2018   LDLCALC 83 12/31/2018   ALT 24 12/31/2018   AST 19 12/31/2018   NA 139 12/31/2018   K 3.9 12/31/2018   CL 108 12/31/2018   CREATININE 0.69 12/31/2018   BUN 9 12/31/2018   CO2 22 12/31/2018   TSH 0.95 12/31/2018    Assessment & Plan:   Tamela Oddiya was seen today for knee pain.  Diagnoses and all orders for this visit:  Patellofemoral pain syndrome of left knee  Tendonitis of wrist, left -     diclofenac sodium  (VOLTAREN) 1 % GEL; Apply 2 g topically 3 (three) times daily.   I am having Obie DredgeNya R. Harnisch start on diclofenac sodium. I am also having her maintain her Cetirizine HCl (ZYRTEC PO), medroxyPROGESTERone, and FLUoxetine.  Meds ordered this encounter  Medications  . diclofenac sodium (VOLTAREN) 1 % GEL    Sig: Apply 2 g topically 3 (three) times daily.    Dispense:  50 g    Refill:  1    Order Specific Question:   Supervising Provider    Answer:   Dianne DunARON, TALIA M [[id number]]    Problem List Items Addressed This Visit      Musculoskeletal and Integument   Patellofemoral pain syndrome of left knee - Primary   Tendonitis of wrist, left   Relevant Medications   diclofenac sodium (VOLTAREN) 1 % GEL       Follow-up: Return if symptoms worsen or fail to improve.  Alysia Pennaharlotte Azarius Lambson, NP

## 2019-01-20 NOTE — Patient Instructions (Addendum)
Use wrist brace AM and PM x 1week, then at night only x 2weeks. Use voltaren gel for wrist pain.  Consider PT for left knee pain if no improvement with home exercise.  Hip Exercises Ask your health care provider which exercises are safe for you. Do exercises exactly as told by your health care provider and adjust them as directed. It is normal to feel mild stretching, pulling, tightness, or discomfort as you do these exercises. Stop right away if you feel sudden pain or your pain gets worse. Do not begin these exercises until told by your health care provider. Stretching and range-of-motion exercises These exercises warm up your muscles and joints and improve the movement and flexibility of your hip. These exercises also help to relieve pain, numbness, and tingling. You may be asked to limit your range of motion if you had a hip replacement. Talk to your health care provider about these restrictions. Hamstrings, supine  1. Lie on your back (supine position). 2. Loop a belt or towel over the ball of your left / right foot. The ball of your foot is on the walking surface, right under your toes. 3. Straighten your left / right knee and slowly pull on the belt or towel to raise your leg until you feel a gentle stretch behind your knee (hamstring). ? Do not let your knee bend while you do this. ? Keep your other leg flat on the floor. 4. Hold this position for __________ seconds. 5. Slowly return your leg to the starting position. Repeat __________ times. Complete this exercise __________ times a day. Hip rotation  1. Lie on your back on a firm surface. 2. With your left / right hand, gently pull your left / right knee toward the shoulder that is on the same side of the body. Stop when your knee is pointing toward the ceiling. 3. Hold your left / right ankle with your other hand. 4. Keeping your knee steady, gently pull your left / right ankle toward your other shoulder until you feel a stretch in  your buttocks. ? Keep your hips and shoulders firmly planted while you do this stretch. 5. Hold this position for __________ seconds. Repeat __________ times. Complete this exercise __________ times a day. Seated stretch This exercise is sometimes called hamstrings and adductors stretch. 1. Sit on the floor with your legs stretched wide. Keep your knees straight during this exercise. 2. Keeping your head and back in a straight line, bend at your waist to reach for your left foot (position A). You should feel a stretch in your right inner thigh (adductors). 3. Hold this position for __________ seconds. Then slowly return to the upright position. 4. Keeping your head and back in a straight line, bend at your waist to reach forward (position B). You should feel a stretch behind both of your thighs and knees (hamstrings). 5. Hold this position for __________ seconds. Then slowly return to the upright position. 6. Keeping your head and back in a straight line, bend at your waist to reach for your right foot (position C). You should feel a stretch in your left inner thigh (adductors). 7. Hold this position for __________ seconds. Then slowly return to the upright position. Repeat __________ times. Complete this exercise __________ times a day. Lunge This exercise stretches the muscles of the hip (hip flexors). 1. Place your left / right knee on the floor and bend your other knee so that is directly over your ankle. You should be  half-kneeling. 2. Keep good posture with your head over your shoulders. 3. Tighten your buttocks to point your tailbone downward. This will prevent your back from arching too much. 4. You should feel a gentle stretch in the front of your left / right thigh and hip. If you do not feel a stretch, slide your other foot forward slightly and then slowly lunge forward with your chest up until your knee once again lines up over your ankle. ? Make sure your tailbone continues to point  downward. 5. Hold this position for __________ seconds. 6. Slowly return to the starting position. Repeat __________ times. Complete this exercise __________ times a day. Strengthening exercises These exercises build strength and endurance in your hip. Endurance is the ability to use your muscles for a long time, even after they get tired. Bridge This exercise strengthens the muscles of your hip (hip extensors). 1. Lie on your back on a firm surface with your knees bent and your feet flat on the floor. 2. Tighten your buttocks muscles and lift your bottom off the floor until the trunk of your body and your hips are level with your thighs. ? Do not arch your back. ? You should feel the muscles working in your buttocks and the back of your thighs. If you do not feel these muscles, slide your feet 1-2 inches (2.5-5 cm) farther away from your buttocks. 3. Hold this position for __________ seconds. 4. Slowly lower your hips to the starting position. 5. Let your muscles relax completely between repetitions. Repeat __________ times. Complete this exercise __________ times a day. Straight leg raises, side-lying This exercise strengthens the muscles that move the hip joint away from the center of the body (hip abductors). 1. Lie on your side with your left / right leg in the top position. Lie so your head, shoulder, hip, and knee line up. You may bend your bottom knee slightly to help you balance. 2. Roll your hips slightly forward, so your hips are stacked directly over each other and your left / right knee is facing forward. 3. Leading with your heel, lift your top leg 4-6 inches (10-15 cm). You should feel the muscles in your top hip lifting. ? Do not let your foot drift forward. ? Do not let your knee roll toward the ceiling. 4. Hold this position for __________ seconds. 5. Slowly return to the starting position. 6. Let your muscles relax completely between repetitions. Repeat __________ times.  Complete this exercise __________ times a day. Straight leg raises, side-lying This exercise strengthen the muscles that move the hip joint toward the center of the body (hip adductors). 1. Lie on your side with your left / right leg in the bottom position. Lie so your head, shoulder, hip, and knee line up. You may place your upper foot in front to help you balance. 2. Roll your hips slightly forward, so your hips are stacked directly over each other and your left / right knee is facing forward. 3. Tense the muscles in your inner thigh and lift your bottom leg 4-6 inches (10-15 cm). 4. Hold this position for __________ seconds. 5. Slowly return to the starting position. 6. Let your muscles relax completely between repetitions. Repeat __________ times. Complete this exercise __________ times a day. Straight leg raises, supine This exercise strengthens the muscles in the front of your thigh (quadriceps). 1. Lie on your back (supine position) with your left / right leg extended and your other knee bent. 2. Tense the  muscles in the front of your left / right thigh. You should see your kneecap slide up or see increased dimpling just above your knee. 3. Keep these muscles tight as you raise your leg 4-6 inches (10-15 cm) off the floor. Do not let your knee bend. 4. Hold this position for __________ seconds. 5. Keep these muscles tense as you lower your leg. 6. Relax the muscles slowly and completely between repetitions. Repeat __________ times. Complete this exercise __________ times a day. Hip abductors, standing This exercise strengthens the muscles that move the leg and hip joint away from the center of the body (hip abductors). 1. Tie one end of a rubber exercise band or tubing to a secure surface, such as a chair, table, or pole. 2. Loop the other end of the band or tubing around your left / right ankle. 3. Keeping your ankle with the band or tubing directly opposite the secured end, step away  until there is tension in the tubing or band. Hold on to a chair, table, or pole as needed for balance. 4. Lift your left / right leg out to your side. While you do this: ? Keep your back upright. ? Keep your shoulders over your hips. ? Keep your toes pointing forward. ? Make sure to use your hip muscles to slowly lift your leg. Do not tip your body or forcefully lift your leg. 5. Hold this position for __________ seconds. 6. Slowly return to the starting position. Repeat __________ times. Complete this exercise __________ times a day. Squats This exercise strengthens the muscles in the front of your thigh (quadriceps). 1. Stand in a door frame so your feet and knees are in line with the frame. You may place your hands on the frame for balance. 2. Slowly bend your knees and lower your hips like you are going to sit in a chair. ? Keep your lower legs in a straight-up-and-down position. ? Do not let your hips go lower than your knees. ? Do not bend your knees lower than told by your health care provider. ? If your hip pain increases, do not bend as low. 3. Hold this position for ___________ seconds. 4. Slowly push with your legs to return to standing. Do not use your hands to pull yourself to standing. Repeat __________ times. Complete this exercise __________ times a day. This information is not intended to replace advice given to you by your health care provider. Make sure you discuss any questions you have with your health care provider. Document Released: 06/08/2005 Document Revised: 04/01/2018 Document Reviewed: 04/01/2018 Elsevier Patient Education  2020 [place].  Patellofemoral Pain Syndrome  Patellofemoral pain syndrome is a condition in which the tissue (cartilage) on the underside of the kneecap (patella) softens or breaks down. This causes pain in the front of the knee. The condition is also called runner's knee or chondromalacia patella. Patellofemoral pain syndrome is most  common in young adults who are active in sports. The knee is the largest joint in the body. The patella covers the front of the knee and is attached to muscles above and below the knee. The underside of the patella is covered with a smooth type of cartilage (synovium). The smooth surface helps the patella to glide easily when you move your knee. Patellofemoral pain syndrome causes swelling in the joint linings and bone surfaces in the knee. What are the causes? This condition may be caused by:  Overuse of the knee.  Poor alignment of your  knee joints.  Weak leg muscles.  A direct blow to your kneecap. What increases the risk? You are more likely to develop this condition if:  You do a lot of activities that can wear down your kneecap. These include: ? Running. ? Squatting. ? Climbing stairs.  You start a new physical activity or exercise program.  You wear shoes that do not fit well.  You do not have good leg strength.  You are overweight. What are the signs or symptoms? The main symptom of this condition is knee pain. This may feel like a dull, aching pain underneath your patella, in the front of your knee. There may be a popping or cracking sound when you move your knee. Pain may get worse with:  Exercise.  Climbing stairs.  Running.  Jumping.  Squatting.  Kneeling.  Sitting for a long time.  Moving or pushing on your patella. How is this diagnosed? This condition may be diagnosed based on:  Your symptoms and medical history. You may be asked about your recent physical activities and which ones cause knee pain.  A physical exam. This may include: ? Moving your patella back and forth. ? Checking your range of knee motion. ? Having you squat or jump to see if you have pain. ? Checking the strength of your leg muscles.  Imaging tests to confirm the diagnosis. These may include an MRI of your knee. How is this treated? This condition may be treated at home with  rest, ice, compression, and elevation (RICE).  Other treatments may include:  Nonsteroidal anti-inflammatory drugs (NSAIDs).  Physical therapy to stretch and strengthen your leg muscles.  Shoe inserts (orthotics) to take stress off your knee.  A knee brace or knee support.  Adhesive tapes to the skin.  Surgery to remove damaged cartilage or move the patella to a better position. This is rare. Follow these instructions at home: If you have a shoe or brace:  Wear the shoe or brace as told by your health care provider. Remove it only as told by your health care provider.  Loosen the shoe or brace if your toes tingle, become numb, or turn cold and blue.  Keep the shoe or brace clean.  If the shoe or brace is not waterproof: ? Do not let it get wet. ? Cover it with a watertight covering when you take a bath or a shower. Managing pain, stiffness, and swelling  If directed, put ice on the painful area. ? If you have a removable shoe or brace, remove it as told by your health care provider. ? Put ice in a plastic bag. ? Place a towel between your skin and the bag. ? Leave the ice on for 20 minutes, 2-3 times a day.  Move your toes often to avoid stiffness and to lessen swelling.  Rest your knee: ? Avoid activities that cause knee pain. ? When sitting or lying down, raise (elevate) the injured area above the level of your heart, whenever possible. General instructions  Take over-the-counter and prescription medicines only as told by your health care provider.  Use splints, braces, knee supports, or walking aids as directed by your health care provider.  Perform stretching and strengthening exercises as told by your health care provider or physical therapist.  Do not use any products that contain nicotine or tobacco, such as cigarettes and e-cigarettes. These can delay healing. If you need help quitting, ask your health care provider.  Return to your normal activities as  told  by your health care provider. Ask your health care provider what activities are safe for you.  Keep all follow-up visits as told by your health care provider. This is important. Contact a health care provider if:  Your symptoms get worse.  You are not improving with home care. Summary  Patellofemoral pain syndrome is a condition in which the tissue (cartilage) on the underside of the kneecap (patella) softens or breaks down.  This condition causes swelling in the joint linings and bone surfaces in the knee. This leads to pain in the front of the knee.  This condition may be treated at home with rest, ice, compression, and elevation (RICE).  Use splints, braces, knee supports, or walking aids as directed by your health care provider. This information is not intended to replace advice given to you by your health care provider. Make sure you discuss any questions you have with your health care provider. Document Released: 05/09/2009 Document Revised: 07/01/2017 Document Reviewed: 07/01/2017 Elsevier Patient Education  2020 [employer]

## 2019-01-22 ENCOUNTER — Telehealth: Payer: Self-pay | Admitting: Nurse Practitioner

## 2019-01-22 NOTE — Telephone Encounter (Signed)
Pharmacy is aware, pt pick up rx already today.

## 2019-01-22 NOTE — Telephone Encounter (Signed)
PA started for Diclofanec gel, waiting for an approve  Conservation officer, nature (Key: Earlene Plater)

## 2019-01-30 ENCOUNTER — Ambulatory Visit (INDEPENDENT_AMBULATORY_CARE_PROVIDER_SITE_OTHER): Payer: BC Managed Care – PPO | Admitting: Psychology

## 2019-01-30 DIAGNOSIS — F4321 Adjustment disorder with depressed mood: Secondary | ICD-10-CM | POA: Diagnosis not present

## 2019-02-23 ENCOUNTER — Ambulatory Visit (INDEPENDENT_AMBULATORY_CARE_PROVIDER_SITE_OTHER): Payer: BC Managed Care – PPO | Admitting: Psychology

## 2019-02-23 DIAGNOSIS — F331 Major depressive disorder, recurrent, moderate: Secondary | ICD-10-CM

## 2019-03-24 ENCOUNTER — Ambulatory Visit (INDEPENDENT_AMBULATORY_CARE_PROVIDER_SITE_OTHER): Payer: BC Managed Care – PPO | Admitting: Psychology

## 2019-03-24 DIAGNOSIS — F331 Major depressive disorder, recurrent, moderate: Secondary | ICD-10-CM | POA: Diagnosis not present

## 2019-03-31 ENCOUNTER — Ambulatory Visit (INDEPENDENT_AMBULATORY_CARE_PROVIDER_SITE_OTHER): Payer: BC Managed Care – PPO

## 2019-03-31 ENCOUNTER — Other Ambulatory Visit: Payer: Self-pay

## 2019-03-31 DIAGNOSIS — Z3042 Encounter for surveillance of injectable contraceptive: Secondary | ICD-10-CM

## 2019-03-31 MED ORDER — MEDROXYPROGESTERONE ACETATE 150 MG/ML IM SUSP
150.0000 mg | Freq: Once | INTRAMUSCULAR | Status: AC
  Administered 2019-03-31: 150 mg via INTRAMUSCULAR

## 2019-03-31 NOTE — Progress Notes (Signed)
After obtaining consent, and per orders of Wilfred Lacy, NP, injection of Depo Provera 150mg /mL given in RUOQ of glute by Kenson Groh Berneta Sages. Patient instructed to remain in clinic for 20 minutes afterwards, and to report any adverse reaction to me immediately. Next injection due 1.12.21-1.26.21 in LUOQ/thx dmf

## 2019-03-31 NOTE — Progress Notes (Signed)
Medical screening examination/treatment/procedure(s) were performed by the CMA. As primary care provider I was immediately available for consulation/collaboration. I agree with above documentation. Charlotte Nche, AGNP-C 

## 2019-04-21 ENCOUNTER — Ambulatory Visit (INDEPENDENT_AMBULATORY_CARE_PROVIDER_SITE_OTHER): Payer: BC Managed Care – PPO | Admitting: Psychology

## 2019-04-21 DIAGNOSIS — F331 Major depressive disorder, recurrent, moderate: Secondary | ICD-10-CM

## 2019-05-13 ENCOUNTER — Telehealth: Payer: Self-pay | Admitting: Nurse Practitioner

## 2019-05-13 MED ORDER — FLUOXETINE HCL 20 MG PO CAPS: 20.0000 mg | ORAL_CAPSULE | Freq: Every day | ORAL | 1 refills | Status: DC

## 2019-05-13 NOTE — Telephone Encounter (Signed)
Pt is aware of this change, rx sent to mail order.

## 2019-05-13 NOTE — Telephone Encounter (Signed)
Received message from [facility] request change from Fluoxetine HCL tab 20 mg to Fluoxetine HCL Caps instead for insurance to cover.   Please advise.

## 2019-05-13 NOTE — Telephone Encounter (Signed)
Ok to change. They should notify patient of change

## 2019-06-16 ENCOUNTER — Ambulatory Visit: Payer: BC Managed Care – PPO

## 2019-06-16 ENCOUNTER — Other Ambulatory Visit: Payer: Self-pay

## 2019-06-17 ENCOUNTER — Ambulatory Visit (INDEPENDENT_AMBULATORY_CARE_PROVIDER_SITE_OTHER): Payer: BC Managed Care – PPO

## 2019-06-17 DIAGNOSIS — Z3042 Encounter for surveillance of injectable contraceptive: Secondary | ICD-10-CM | POA: Diagnosis not present

## 2019-06-17 MED ORDER — MEDROXYPROGESTERONE ACETATE 150 MG/ML IM SUSY: PREFILLED_SYRINGE | Freq: Once | INTRAMUSCULAR | Status: AC

## 2019-06-17 NOTE — Progress Notes (Signed)
Per orders of Claris Gower Nche injection of Medroxyprogesterone (Depo provera) 150mg  given by Georjean Toya L Emanuelle Bastos in RUQ of glute. Patient tolerated injection well. Patient made appointment for 09/02/2019 for next injection.  Should be given in LUQ.

## 2019-06-17 NOTE — Progress Notes (Signed)
Medical screening examination/treatment/procedure(s) were performed by the CMA. As primary care provider I was immediately available for consulation/collaboration. I agree with above documentation. Kaylanni Ezelle, AGNP-C 

## 2019-06-17 NOTE — Patient Instructions (Signed)
Health Maintenance Due  Topic Date Due  . INFLUENZA VACCINE  01/03/2019    Depression screen [facility] 2/9 12/31/2018 12/31/2018 07/29/2018  Decreased Interest 1 2 2   Down, Depressed, Hopeless 1 1 2   PHQ - 2 Score 2 3 4   Altered sleeping 0 1 2  Tired, decreased energy 1 0 3  Change in appetite 1 0 2  Feeling bad or failure about yourself  0 - 1  Trouble concentrating 1 - 1  Moving slowly or fidgety/restless 0 - 1  Suicidal thoughts 0 - 0  PHQ-9 Score 5 4 14

## 2019-08-15 ENCOUNTER — Ambulatory Visit: Payer: BC Managed Care – PPO | Attending: Internal Medicine

## 2019-08-15 DIAGNOSIS — Z23 Encounter for immunization: Secondary | ICD-10-CM

## 2019-08-15 NOTE — Progress Notes (Signed)
   Covid-19 Vaccination Clinic  Name:  Kristin Spencer    MRN: [id number] DOB: [birth year]  08/15/2019  Kristin Spencer was observed post Covid-19 immunization for 15 minutes without incident. She was provided with Vaccine Information Sheet and instruction to access the V-Safe system.   Kristin Spencer was instructed to call 911 with any severe reactions post vaccine: Marland Kitchen Difficulty breathing  . Swelling of face and throat  . A fast heartbeat  . A bad rash all over body  . Dizziness and weakness   Immunizations Administered    Name Date Dose VIS Date Route   Pfizer COVID-19 Vaccine 08/15/2019  9:37 AM 0.3 mL 05/15/2019 Intramuscular   Manufacturer: [employer], [employer]   Lot: [id number]   NDC: [address]-4

## 2019-08-25 ENCOUNTER — Other Ambulatory Visit: Payer: Self-pay

## 2019-08-26 ENCOUNTER — Ambulatory Visit (INDEPENDENT_AMBULATORY_CARE_PROVIDER_SITE_OTHER): Payer: BC Managed Care – PPO | Admitting: Nurse Practitioner

## 2019-08-26 ENCOUNTER — Other Ambulatory Visit (HOSPITAL_COMMUNITY)
Admission: RE | Admit: 2019-08-26 | Discharge: 2019-08-26 | Disposition: A | Discharge status: Home / Self Care | Payer: BC Managed Care – PPO | Source: Ambulatory Visit | Attending: Nurse Practitioner | Admitting: Nurse Practitioner

## 2019-08-26 ENCOUNTER — Encounter: Payer: Self-pay | Admitting: Nurse Practitioner

## 2019-08-26 VITALS — BP 118/80 | HR 79 | Temp 96.5°F | Ht 63.0 in | Wt 188.4 lb

## 2019-08-26 DIAGNOSIS — Z91013 Allergy to seafood: Secondary | ICD-10-CM

## 2019-08-26 DIAGNOSIS — J309 Allergic rhinitis, unspecified: Secondary | ICD-10-CM | POA: Insufficient documentation

## 2019-08-26 DIAGNOSIS — J302 Other seasonal allergic rhinitis: Secondary | ICD-10-CM

## 2019-08-26 DIAGNOSIS — N760 Acute vaginitis: Secondary | ICD-10-CM | POA: Insufficient documentation

## 2019-08-26 NOTE — Progress Notes (Signed)
Subjective:  Patient ID: Kristin Spencer, female    DOB: [birth year]  Age: 24 y.o. MRN: [id number]  CC: Vaginal Discharge (pt reported day after first Pfizer vaccine on the 13th she noticed dark brown discharged and light red//shes worried about BV and due for a depo shot in a week//pt reported no burning no pain just dishcharge)  Vaginal Discharge The patient's primary symptoms include vaginal discharge. The patient's pertinent negatives include no genital itching, genital lesions, genital odor, genital rash, missed menses, pelvic pain or vaginal bleeding. This is a new problem. The current episode started in the past 7 days. The problem occurs constantly. The problem has been unchanged. The patient is experiencing no pain. She is not pregnant. Pertinent negatives include no abdominal pain, anorexia, back pain, chills, constipation, discolored urine, dysuria, fever, flank pain, frequency, nausea, painful intercourse, rash or urgency. The vaginal discharge was thick and brown. There has been no bleeding. She has not been passing clots. She has not been passing tissue. Nothing aggravates the symptoms. She has tried nothing for the symptoms. She is sexually active. No, her partner does not have an STD. She uses progestin injections for contraception. Her menstrual history has been regular. There is no history of miscarriage, PID, an STD, a terminated pregnancy or vaginosis.   She will also like referral to allergist due to recurrent seasonal allergies and worsening each year despite use of antihistamines. 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  Reviewed past Medical, Social and Family history today.  Outpatient Medications Prior to Visit  Medication Sig Dispense Refill  . Cetirizine HCl (ZYRTEC PO) Take by mouth.    . diclofenac sodium (VOLTAREN) 1 % GEL Apply 2 g topically 3 (three) times daily. 50 g 1  . FLUoxetine (PROZAC) 20 MG capsule Take 1 capsule (20 mg total) by mouth  daily. 90 capsule 1  . medroxyPROGESTERone (DEPO-PROVERA) 150 MG/ML injection Inject 1 mL (150 mg total) into the muscle once for 1 dose. 1 mL    No facility-administered medications prior to visit.    ROS See HPI  Objective:  BP 118/80   Pulse 79   Temp (!) 96.5 F (35.8 C) (Tympanic)   Ht 5\' 3"  (1.6 m)   Wt 188 lb 6.4 oz (85.5 kg)   SpO2 98%   BMI 33.37 kg/m   BP Readings from Last 3 Encounters:  08/26/19 118/80  01/20/19 118/82  12/31/18 122/80    Wt Readings from Last 3 Encounters:  08/26/19 188 lb 6.4 oz (85.5 kg)  01/20/19 185 lb 9.6 oz (84.2 kg)  12/31/18 184 lb (83.5 kg)    Physical Exam Vitals reviewed. Exam conducted with a chaperone present.  Genitourinary:    General: Normal vulva.     Labia:        Right: No rash or tenderness.        Left: No rash or tenderness.      Vagina: Vaginal discharge present.     Cervix: Discharge and erythema present.     Adnexa: Right adnexa normal and left adnexa normal.  Lymphadenopathy:     Lower Body: No right inguinal adenopathy. No left inguinal adenopathy.  Skin:    General: Skin is warm and dry.     Findings: No rash.  Neurological:     Mental Status: She is alert and oriented to person, place, and time.     Lab Results  Component Value Date   WBC 4.4 12/31/2018  HGB 13.0 12/31/2018   HCT 39.2 12/31/2018   PLT 296.0 12/31/2018   GLUCOSE 100 (H) 12/31/2018   CHOL 135 12/31/2018   TRIG 67.0 12/31/2018   HDL 39.00 (L) 12/31/2018   LDLCALC 83 12/31/2018   ALT 24 12/31/2018   AST 19 12/31/2018   NA 139 12/31/2018   K 3.9 12/31/2018   CL 108 12/31/2018   CREATININE 0.69 12/31/2018   BUN 9 12/31/2018   CO2 22 12/31/2018   TSH 0.95 12/31/2018    Assessment & Plan:  This visit occurred during the SARS-CoV-2 public health emergency.  Safety protocols were in place, including screening questions prior to the visit, additional usage of staff PPE, and extensive cleaning of exam room while observing  appropriate contact time as indicated for disinfecting solutions.   Kristin Spencer was seen today for vaginal discharge.  Diagnoses and all orders for this visit:  Acute vaginitis -     Cancel: Cervicovaginal ancillary only( [place]) -     Cervicovaginal ancillary only( [place])  Seasonal allergic rhinitis, unspecified trigger -     Ambulatory referral to Allergy  Allergy to shellfish -     Ambulatory referral to Allergy   I am having Deatra James maintain her Cetirizine HCl (ZYRTEC PO), medroxyPROGESTERone, diclofenac sodium, and FLUoxetine.  No orders of the defined types were placed in this encounter.   Problem List Items Addressed This Visit      Respiratory   Allergic rhinitis   Relevant Orders   Ambulatory referral to Allergy    Other Visit Diagnoses    Acute vaginitis    -  Primary   Relevant Orders   Cervicovaginal ancillary only( ) (Completed)   Allergy to shellfish       Relevant Orders   Ambulatory referral to Allergy       Follow-up: Return if symptoms worsen or fail to improve.  Wilfred Lacy, NP

## 2019-08-27 LAB — CERVICOVAGINAL ANCILLARY ONLY
Bacterial Vaginitis (gardnerella): NEGATIVE
Candida Glabrata: NEGATIVE
Candida Vaginitis: NEGATIVE
Chlamydia: NEGATIVE
Comment: NEGATIVE
Comment: NEGATIVE
Comment: NEGATIVE
Comment: NEGATIVE
Comment: NEGATIVE
Comment: NORMAL
Neisseria Gonorrhea: NEGATIVE
Trichomonas: NEGATIVE

## 2019-09-01 ENCOUNTER — Other Ambulatory Visit: Payer: Self-pay

## 2019-09-02 ENCOUNTER — Ambulatory Visit (INDEPENDENT_AMBULATORY_CARE_PROVIDER_SITE_OTHER): Payer: BC Managed Care – PPO

## 2019-09-02 DIAGNOSIS — Z3042 Encounter for surveillance of injectable contraceptive: Secondary | ICD-10-CM

## 2019-09-02 MED ORDER — MEDROXYPROGESTERONE ACETATE 150 MG/ML IM SUSP
150.0000 mg | Freq: Once | INTRAMUSCULAR | Status: AC
  Administered 2019-09-02: 150 mg via INTRAMUSCULAR

## 2019-09-02 NOTE — Progress Notes (Signed)
Medical screening examination/treatment/procedure(s) were performed by the LPN. As primary care provider I was immediately available for consulation/collaboration. I agree with above documentation. Charlotte Nche, AGNP-C 

## 2019-09-02 NOTE — Progress Notes (Signed)
Pt came into get depo provera injection into the left Ventrogluteal, pt tolerated injection well and schedule next depo provera appt between 06/16-06/30.

## 2019-09-08 ENCOUNTER — Ambulatory Visit: Payer: BC Managed Care – PPO | Attending: Internal Medicine

## 2019-09-08 DIAGNOSIS — Z23 Encounter for immunization: Secondary | ICD-10-CM

## 2019-09-08 NOTE — Progress Notes (Signed)
   Covid-19 Vaccination Clinic  Name:  Kristin Spencer    MRN: [id number] DOB: Apr 27, 1996  09/08/2019  Kristin Spencer was observed post Covid-19 immunization for 15 minutes without incident. She was provided with Vaccine Information Sheet and instruction to access the V-Safe system.   Kristin Spencer was instructed to call 911 with any severe reactions post vaccine: Marland Kitchen Difficulty breathing  . Swelling of face and throat  . A fast heartbeat  . A bad rash all over body  . Dizziness and weakness   Immunizations Administered    Name Date Dose VIS Date Route   Pfizer COVID-19 Vaccine 09/08/2019 10:05 AM 0.3 mL 05/15/2019 Intramuscular   Manufacturer: [employer], [employer]   Lot: [id number]   NDC: [address]-2

## 2019-10-20 ENCOUNTER — Ambulatory Visit: Payer: BC Managed Care – PPO | Admitting: Pediatrics

## 2019-10-20 ENCOUNTER — Other Ambulatory Visit: Payer: Self-pay

## 2019-10-20 ENCOUNTER — Encounter: Payer: Self-pay | Admitting: Pediatrics

## 2019-10-20 VITALS — BP 122/82 | HR 81 | Temp 98.7°F | Resp 20 | Ht 63.0 in | Wt 187.6 lb

## 2019-10-20 DIAGNOSIS — K219 Gastro-esophageal reflux disease without esophagitis: Secondary | ICD-10-CM

## 2019-10-20 DIAGNOSIS — E669 Obesity, unspecified: Secondary | ICD-10-CM

## 2019-10-20 DIAGNOSIS — J3089 Other allergic rhinitis: Secondary | ICD-10-CM

## 2019-10-20 DIAGNOSIS — H1010 Acute atopic conjunctivitis, unspecified eye: Secondary | ICD-10-CM

## 2019-10-20 MED ORDER — FLUTICASONE PROPIONATE 50 MCG/ACT NA SUSP: NASAL | 5 refills | Status: DC

## 2019-10-20 MED ORDER — AZELASTINE HCL 0.1 % NA SOLN
NASAL | 5 refills | Status: DC
Start: 2019-10-20 — End: 2022-03-13

## 2019-10-20 MED ORDER — FAMOTIDINE 20 MG PO TABS: ORAL_TABLET | ORAL | 5 refills | Status: DC

## 2019-10-20 NOTE — Patient Instructions (Addendum)
Environmental control of dust and mold Zyrtec 10 mg-take 1 tablet once a day for runny nose or itchy eyes Fluticasone 2 sprays per nostril once a day if needed for stuffy nose Visine AC -1 drop 3 times a day if needed for itchy eyes Azelastine 0.1% - 2 sprays per nostril twice a day if needed for sinus headache Add prednisone 10 mg tablet-take 2 tablets twice a day for 3 days, 2 tablets on the fourth day, 1 tablet on the fifth day to bring your allergic symptoms under control  She may continue eating shrimp.  Famotidine 20 mg-take 1 tablet twice a day if needed for heartburn  Call us if you are not doing well on this treatment plan

## 2019-10-20 NOTE — Progress Notes (Signed)
[name] [address] Dept: [phone]  New Patient Note  Patient ID: Kristin Spencer, female    DOB: 07-22-1995  Age: 24 y.o. MRN: [id number] Date of Office Visit: 10/20/2019 Referring provider: Flossie Buffy, NP [address] [place],  [place] [address]    Chief Complaint: Allergic Rhinitis  (all year round) and Food Intolerance (After eating shrimp pt. would vomit immediately)  HPI [name] presents for an allergy evaluation.  She has had seasonal allergic rhinitis since childhood.  She now has perennial allergic rhinitis.  She has aggravation of her symptoms on exposure to dust, cigarette smoke, the springtime of the year..  In the past she has had vomiting of crab and lobster but she can eat shrimp without any problems .  She did not have hives or cardiorespiratory symptoms from crab or lobster.  She has not had eczema, asthma, pneumonia or sinus infections.  At times she does have gastroesophageal reflux.  She has tried Zyrtec for control of her symptoms.  She does have itchy eyes.  At times she has sinus headaches  Review of Systems  Constitutional: Negative.   HENT:       Seasonal allergic rhinitis for several years  Eyes:       Itchy eyes at times  Respiratory: Negative.   Cardiovascular: Negative.   Gastrointestinal:       Heartburn at times  Genitourinary: Negative.   Musculoskeletal: Negative.   Skin: Negative.   Neurological: Negative.   Endo/Heme/Allergies:       No diabetes or thyroid disease.  Possible sickle cell trait  Psychiatric/Behavioral: Negative.     Outpatient Encounter Medications as of 10/20/2019  Medication Sig  . Cetirizine HCl (ZYRTEC PO) Take by mouth.  . diclofenac sodium (VOLTAREN) 1 % GEL Apply 2 g topically 3 (three) times daily. (Patient taking differently: Apply 2 g topically as needed. )  . FLUoxetine (PROZAC) 20 MG capsule Take 1 capsule (20 mg total) by mouth daily.  . medroxyPROGESTERone  (DEPO-PROVERA) 150 MG/ML injection Inject 1 mL (150 mg total) into the muscle once for 1 dose.  Marland Kitchen azelastine (ASTELIN) 0.1 % nasal spray 2 sprays per nostril twice a day if needed for sinus headache  . famotidine (PEPCID) 20 MG tablet Take 1 tablet twice a day if needed for heartburn  . fluticasone (FLONASE) 50 MCG/ACT nasal spray 2 sprays per nostril once a day if needed for stuffy nose   No facility-administered encounter medications on file as of 10/20/2019.     Drug Allergies:  Allergies  Allergen Reactions  . Shellfish Allergy Shortness Of Breath and Nausea And Vomiting    Family History: Auri's family history includes Allergic rhinitis in her father and sister; Diabetes in her father, maternal grandfather, paternal grandfather, paternal uncle, and paternal uncle; Food Allergy in her father; Heart attack in her paternal grandfather; Heart disease in her paternal grandfather..  Family history is positive for asthma .  Family history is negative for angioedema, eczema, chronic hives, chronic bronchitis or emphysema.  Social and environmental.  She has 2 dogs in the home.  She is not exposed to cigarette smoking.  She has not smoked cigarettes in the past.  She walks and babysits dogs.  Physical Exam: BP 122/82 (BP Location: Left Arm, Patient Position: Sitting, Cuff Size: Large)   Pulse 81   Temp 98.7 F (37.1 C) (Oral)   Resp 20   Ht 5\' 3"  (1.6 m)   Wt  187 lb 9.8 oz (85.1 kg)   SpO2 98%   BMI 33.23 kg/m    Physical Exam Vitals reviewed.  Constitutional:      Appearance: Normal appearance. She is obese.  HENT:     Head:     Comments: Eyes showed mild erythema of the palpebral conjunctiva.  Ears normal.  Nose moderate swelling of nasal turbinates with clear nasal discharge.  Pharynx normal. Neck:     Comments: No thyromegaly Cardiovascular:     Rate and Rhythm: Normal rate and regular rhythm.     Comments: S1-S2 normal no murmurs Pulmonary:     Comments: Clear to percussion  and auscultation Abdominal:     Palpations: Abdomen is soft.     Tenderness: There is no abdominal tenderness.     Comments: No hepatosplenomegaly  Musculoskeletal:     Cervical back: Neck supple.  Lymphadenopathy:     Cervical: No cervical adenopathy.  Skin:    Comments: No hepatosplenomegaly  Neurological:     General: No focal deficit present.     Mental Status: She is alert and oriented to person, place, and time. Mental status is at baseline.  Psychiatric:        Mood and Affect: Mood normal.        Behavior: Behavior normal.        Thought Content: Thought content normal.        Judgment: Judgment normal.     Diagnostics: Allergy skin test were positive to tree pollens, weed pollen and some molds on intradermal testing only.  Skin testing to shellfish was negative   Assessment  Assessment and Plan: 1. Other allergic rhinitis   2. Seasonal allergic conjunctivitis   3. Gastroesophageal reflux disease without esophagitis   4. Obesity (BMI 30.0-34.9)     Meds ordered this encounter  Medications  . fluticasone (FLONASE) 50 MCG/ACT nasal spray    Sig: 2 sprays per nostril once a day if needed for stuffy nose    Dispense:  16 g    Refill:  5  . azelastine (ASTELIN) 0.1 % nasal spray    Sig: 2 sprays per nostril twice a day if needed for sinus headache    Dispense:  30 mL    Refill:  5  . famotidine (PEPCID) 20 MG tablet    Sig: Take 1 tablet twice a day if needed for heartburn    Dispense:  64 tablet    Refill:  5    Patient Instructions  Environmental control of dust and mold Zyrtec 10 mg-take 1 tablet once a day for runny nose or itchy eyes Fluticasone 2 sprays per nostril once a day if needed for stuffy nose Visine AC -1 drop 3 times a day if needed for itchy eyes Azelastine 0.1% - 2 sprays per nostril twice a day if needed for sinus headache Add prednisone 10 mg tablet-take 2 tablets twice a day for 3 days, 2 tablets on the fourth day, 1 tablet on the fifth  day to bring your allergic symptoms under control  She may continue eating shrimp.  Famotidine 20 mg-take 1 tablet twice a day if needed for heartburn  Call us if you are not doing well on this treatment plan    Return in about 4 weeks (around 11/17/2019).   Thank you for the opportunity to care for this patient.  Please do not hesitate to contact me with questions.  Tonette Bihari, M.D.  [facility]  [address] [place], [place] [address] [phone]

## 2019-10-29 ENCOUNTER — Encounter (HOSPITAL_BASED_OUTPATIENT_CLINIC_OR_DEPARTMENT_OTHER): Payer: Self-pay | Admitting: Emergency Medicine

## 2019-10-29 ENCOUNTER — Emergency Department (HOSPITAL_BASED_OUTPATIENT_CLINIC_OR_DEPARTMENT_OTHER)
Admission: EM | Admit: 2019-10-29 | Discharge: 2019-10-29 | Disposition: A | Discharge status: Home / Self Care | Payer: BC Managed Care – PPO | Attending: Emergency Medicine | Admitting: Emergency Medicine

## 2019-10-29 ENCOUNTER — Emergency Department (HOSPITAL_BASED_OUTPATIENT_CLINIC_OR_DEPARTMENT_OTHER): Payer: BC Managed Care – PPO

## 2019-10-29 DIAGNOSIS — S8002XA Contusion of left knee, initial encounter: Secondary | ICD-10-CM | POA: Diagnosis not present

## 2019-10-29 DIAGNOSIS — Y999 Unspecified external cause status: Secondary | ICD-10-CM | POA: Diagnosis not present

## 2019-10-29 DIAGNOSIS — S6992XA Unspecified injury of left wrist, hand and finger(s), initial encounter: Secondary | ICD-10-CM | POA: Diagnosis present

## 2019-10-29 DIAGNOSIS — S66912A Strain of unspecified muscle, fascia and tendon at wrist and hand level, left hand, initial encounter: Secondary | ICD-10-CM

## 2019-10-29 DIAGNOSIS — T22112A Burn of first degree of left forearm, initial encounter: Secondary | ICD-10-CM | POA: Diagnosis not present

## 2019-10-29 DIAGNOSIS — X19XXXA Contact with other heat and hot substances, initial encounter: Secondary | ICD-10-CM | POA: Insufficient documentation

## 2019-10-29 DIAGNOSIS — Y9389 Activity, other specified: Secondary | ICD-10-CM | POA: Insufficient documentation

## 2019-10-29 DIAGNOSIS — Z79899 Other long term (current) drug therapy: Secondary | ICD-10-CM | POA: Insufficient documentation

## 2019-10-29 DIAGNOSIS — Y9289 Other specified places as the place of occurrence of the external cause: Secondary | ICD-10-CM | POA: Diagnosis not present

## 2019-10-29 MED ORDER — KETOROLAC TROMETHAMINE 30 MG/ML IJ SOLN
30.0000 mg | Freq: Once | INTRAMUSCULAR | Status: AC
  Administered 2019-10-29: 30 mg via INTRAMUSCULAR
  Filled 2019-10-29: qty 1

## 2019-10-29 MED ORDER — HYDROCODONE-ACETAMINOPHEN 5-325 MG PO TABS: 1.0000 | ORAL_TABLET | ORAL | 0 refills | Status: DC | PRN

## 2019-10-29 MED ORDER — IBUPROFEN 600 MG PO TABS
600.0000 mg | ORAL_TABLET | Freq: Four times a day (QID) | ORAL | 0 refills | Status: DC | PRN
Start: 2019-10-29 — End: 2020-03-09

## 2019-10-29 MED ORDER — SILVER SULFADIAZINE 1 % EX CREA
TOPICAL_CREAM | Freq: Once | CUTANEOUS | Status: AC
  Filled 2019-10-29: qty 85
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
## 2019-10-30 ENCOUNTER — Encounter: Payer: Self-pay | Admitting: Pediatrics

## 2019-11-03 ENCOUNTER — Ambulatory Visit: Payer: BC Managed Care – PPO | Admitting: Family Medicine

## 2019-11-03 ENCOUNTER — Encounter: Payer: Self-pay | Admitting: Family Medicine

## 2019-11-03 ENCOUNTER — Other Ambulatory Visit: Payer: Self-pay

## 2019-11-03 VITALS — BP 102/70 | HR 95 | Temp 98.5°F | Ht 63.0 in | Wt 187.6 lb

## 2019-11-03 DIAGNOSIS — S39012A Strain of muscle, fascia and tendon of lower back, initial encounter: Secondary | ICD-10-CM

## 2019-11-03 DIAGNOSIS — S161XXA Strain of muscle, fascia and tendon at neck level, initial encounter: Secondary | ICD-10-CM

## 2019-11-03 DIAGNOSIS — S5012XA Contusion of left forearm, initial encounter: Secondary | ICD-10-CM

## 2019-11-03 HISTORY — DX: Strain of muscle, fascia and tendon of lower back, initial encounter: S39.012A

## 2019-11-03 HISTORY — DX: Strain of muscle, fascia and tendon at neck level, initial encounter: S16.1XXA

## 2019-11-03 MED ORDER — METHOCARBAMOL 500 MG PO TABS: 500.0000 mg | ORAL_TABLET | Freq: Three times a day (TID) | ORAL | 0 refills | Status: DC | PRN
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
## 2019-11-03 NOTE — Patient Instructions (Signed)
Cervical Strain and Sprain Rehab Ask your health care provider which exercises are safe for you. Do exercises exactly as told by your health care provider and adjust them as directed. It is normal to feel mild stretching, pulling, tightness, or discomfort as you do these exercises. Stop right away if you feel sudden pain or your pain gets worse. Do not begin these exercises until told by your health care provider. Stretching and range-of-motion exercises Cervical side bending  1. Using good posture, sit on a stable chair or stand up. 2. Without moving your shoulders, slowly tilt your left / right ear to your shoulder until you feel a stretch in the opposite side neck muscles. You should be looking straight ahead. 3. Hold for __________ seconds. 4. Repeat with the other side of your neck. Repeat __________ times. Complete this exercise __________ times a day. Cervical rotation  1. Using good posture, sit on a stable chair or stand up. 2. Slowly turn your head to the side as if you are looking over your left / right shoulder. ? Keep your eyes level with the ground. ? Stop when you feel a stretch along the side and the back of your neck. 3. Hold for __________ seconds. 4. Repeat this by turning to your other side. Repeat __________ times. Complete this exercise __________ times a day. Thoracic extension and pectoral stretch 1. Roll a towel or a small blanket so it is about 4 inches (10 cm) in diameter. 2. Lie down on your back on a firm surface. 3. Put the towel lengthwise, under your spine in the middle of your back. It should not be under your shoulder blades. The towel should line up with your spine from your middle back to your lower back. 4. Put your hands behind your head and let your elbows fall out to your sides. 5. Hold for __________ seconds. Repeat __________ times. Complete this exercise __________ times a day. Strengthening exercises Isometric upper cervical flexion 1. Lie on  your back with a thin pillow behind your head and a small rolled-up towel under your neck. 2. Gently tuck your chin toward your chest and nod your head down to look toward your feet. Do not lift your head off the pillow. 3. Hold for __________ seconds. 4. Release the tension slowly. Relax your neck muscles completely before you repeat this exercise. Repeat __________ times. Complete this exercise __________ times a day. Isometric cervical extension  1. Stand about 6 inches (15 cm) away from a wall, with your back facing the wall. 2. Place a soft object, about 6-8 inches (15-20 cm) in diameter, between the back of your head and the wall. A soft object could be a small pillow, a ball, or a folded towel. 3. Gently tilt your head back and press into the soft object. Keep your jaw and forehead relaxed. 4. Hold for __________ seconds. 5. Release the tension slowly. Relax your neck muscles completely before you repeat this exercise. Repeat __________ times. Complete this exercise __________ times a day. Posture and body mechanics Body mechanics refers to the movements and positions of your body while you do your daily activities. Posture is part of body mechanics. Good posture and healthy body mechanics can help to relieve stress in your body's tissues and joints. Good posture means that your spine is in its natural S-curve position (your spine is neutral), your shoulders are pulled back slightly, and your head is not tipped forward. The following are general guidelines for applying improved   posture and body mechanics to your everyday activities. Sitting  1. When sitting, keep your spine neutral and keep your feet flat on the floor. Use a footrest, if necessary, and keep your thighs parallel to the floor. Avoid rounding your shoulders, and avoid tilting your head forward. 2. When working at a desk or a computer, keep your desk at a height where your hands are slightly lower than your elbows. Slide your  chair under your desk so you are close enough to maintain good posture. 3. When working at a computer, place your monitor at a height where you are looking straight ahead and you do not have to tilt your head forward or downward to look at the screen. Standing   When standing, keep your spine neutral and keep your feet about hip-width apart. Keep a slight bend in your knees. Your ears, shoulders, and hips should line up.  When you do a task in which you stand in one place for a long time, place one foot up on a stable object that is 2-4 inches (5-10 cm) high, such as a footstool. This helps keep your spine neutral. Resting When lying down and resting, avoid positions that are most painful for you. Try to support your neck in a neutral position. You can use a contour pillow or a small rolled-up towel. Your pillow should support your neck but not push on it. This information is not intended to replace advice given to you by your health care provider. Make sure you discuss any questions you have with your health care provider. Document Revised: 09/10/2018 Document Reviewed: 02/19/2018 Elsevier Patient Education  [place].  Back Exercises These exercises help to make your trunk and back strong. They also help to keep the lower back flexible. Doing these exercises can help to prevent back pain or lessen existing pain.  If you have back pain, try to do these exercises 2-3 times each day or as told by your doctor.  As you get better, do the exercises once each day. Repeat the exercises more often as told by your doctor.  To stop back pain from coming back, do the exercises once each day, or as told by your doctor. Exercises Single knee to chest Do these steps 3-5 times in a row for each leg: 5. Lie on your back on a firm bed or the floor with your legs stretched out. 6. Bring one knee to your chest. 7. Grab your knee or thigh with both hands and hold them it in place. 8. Pull on your  knee until you feel a gentle stretch in your lower back or buttocks. 9. Keep doing the stretch for 10-30 seconds. 10. Slowly let go of your leg and straighten it. Pelvic tilt Do these steps 5-10 times in a row: 6. Lie on your back on a firm bed or the floor with your legs stretched out. 7. Bend your knees so they point up to the ceiling. Your feet should be flat on the floor. 8. Tighten your lower belly (abdomen) muscles to press your lower back against the floor. This will make your tailbone point up to the ceiling instead of pointing down to your feet or the floor. 9. Stay in this position for 5-10 seconds while you gently tighten your muscles and breathe evenly. Cat-cow Do these steps until your lower back bends more easily: 5. Get on your hands and knees on a firm surface. Keep your hands under your shoulders, and keep your  knees under your hips. You may put padding under your knees. 6. Let your head hang down toward your chest. Tighten (contract) the muscles in your belly. Point your tailbone toward the floor so your lower back becomes rounded like the back of a cat. 7. Stay in this position for 5 seconds. 8. Slowly lift your head. Let the muscles of your belly relax. Point your tailbone up toward the ceiling so your back forms a sagging arch like the back of a cow. 9. Stay in this position for 5 seconds.  Press-ups Do these steps 5-10 times in a row: 6. Lie on your belly (face-down) on the floor. 7. Place your hands near your head, about shoulder-width apart. 8. While you keep your back relaxed and keep your hips on the floor, slowly straighten your arms to raise the top half of your body and lift your shoulders. Do not use your back muscles. You may change where you place your hands in order to make yourself more comfortable. 9. Stay in this position for 5 seconds. 10. Slowly return to lying flat on the floor.  Bridges Do these steps 10 times in a row: 4. Lie on your back on a firm  surface. 5. Bend your knees so they point up to the ceiling. Your feet should be flat on the floor. Your arms should be flat at your sides, next to your body. 6. Tighten your butt muscles and lift your butt off the floor until your waist is almost as high as your knees. If you do not feel the muscles working in your butt and the back of your thighs, slide your feet 1-2 inches farther away from your butt. 7. Stay in this position for 3-5 seconds. 8. Slowly lower your butt to the floor, and let your butt muscles relax. If this exercise is too easy, try doing it with your arms crossed over your chest. Belly crunches Do these steps 5-10 times in a row: 1. Lie on your back on a firm bed or the floor with your legs stretched out. 2. Bend your knees so they point up to the ceiling. Your feet should be flat on the floor. 3. Cross your arms over your chest. 4. Tip your chin a little bit toward your chest but do not bend your neck. 5. Tighten your belly muscles and slowly raise your chest just enough to lift your shoulder blades a tiny bit off of the floor. Avoid raising your body higher than that, because it can put too much stress on your low back. 6. Slowly lower your chest and your head to the floor. Back lifts Do these steps 5-10 times in a row: 1. Lie on your belly (face-down) with your arms at your sides, and rest your forehead on the floor. 2. Tighten the muscles in your legs and your butt. 3. Slowly lift your chest off of the floor while you keep your hips on the floor. Keep the back of your head in line with the curve in your back. Look at the floor while you do this. 4. Stay in this position for 3-5 seconds. 5. Slowly lower your chest and your face to the floor. Contact a doctor if:  Your back pain gets a lot worse when you do an exercise.  Your back pain does not get better 2 hours after you exercise. If you have any of these problems, stop doing the exercises. Do not do them again unless  your doctor says it is  okay. Get help right away if:  You have sudden, very bad back pain. If this happens, stop doing the exercises. Do not do them again unless your doctor says it is okay. This information is not intended to replace advice given to you by your health care provider. Make sure you discuss any questions you have with your health care provider. Document Revised: 02/13/2018 Document Reviewed: 02/13/2018 Elsevier Patient Education  2020 [employer].

## 2019-11-16 ENCOUNTER — Other Ambulatory Visit: Payer: Self-pay

## 2019-11-17 ENCOUNTER — Ambulatory Visit: Payer: BC Managed Care – PPO | Admitting: Family Medicine

## 2019-11-17 ENCOUNTER — Encounter: Payer: Self-pay | Admitting: Family Medicine

## 2019-11-17 VITALS — BP 118/76 | HR 103 | Temp 98.2°F | Ht 63.0 in | Wt 192.4 lb

## 2019-11-17 DIAGNOSIS — S161XXA Strain of muscle, fascia and tendon at neck level, initial encounter: Secondary | ICD-10-CM

## 2019-11-17 DIAGNOSIS — S39012A Strain of muscle, fascia and tendon of lower back, initial encounter: Secondary | ICD-10-CM

## 2019-11-17 DIAGNOSIS — S8002XA Contusion of left knee, initial encounter: Secondary | ICD-10-CM | POA: Insufficient documentation

## 2019-11-17 DIAGNOSIS — S8002XD Contusion of left knee, subsequent encounter: Secondary | ICD-10-CM
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
## 2019-11-18 ENCOUNTER — Other Ambulatory Visit: Payer: Self-pay

## 2019-11-18 ENCOUNTER — Ambulatory Visit (INDEPENDENT_AMBULATORY_CARE_PROVIDER_SITE_OTHER): Payer: BC Managed Care – PPO

## 2019-11-18 DIAGNOSIS — Z309 Encounter for contraceptive management, unspecified: Secondary | ICD-10-CM | POA: Diagnosis not present

## 2019-11-18 MED ORDER — MEDROXYPROGESTERONE ACETATE 150 MG/ML IM SUSY
150.0000 mg | PREFILLED_SYRINGE | Freq: Once | INTRAMUSCULAR | Status: AC
  Administered 2019-11-18: 150 mg via INTRAMUSCULAR

## 2019-11-18 NOTE — Progress Notes (Signed)
Pt came in for her Depo shot. Depo was adinistered in the RUQ of the Ventrogluteal, pt tolerated injection well and scheduled her next depo provera for February 04, 2020.

## 2019-12-16 NOTE — Progress Notes (Signed)
Medical screening examination/treatment/procedure(s) were performed by the CMA. As primary care provider I was immediately available for consulation/collaboration. I agree with above documentation. Kariya Lavergne, AGNP-C 

## 2020-02-03 ENCOUNTER — Other Ambulatory Visit: Payer: Self-pay

## 2020-02-04 ENCOUNTER — Ambulatory Visit (INDEPENDENT_AMBULATORY_CARE_PROVIDER_SITE_OTHER): Payer: BC Managed Care – PPO

## 2020-02-04 DIAGNOSIS — Z3042 Encounter for surveillance of injectable contraceptive: Secondary | ICD-10-CM

## 2020-02-04 MED ORDER — MEDROXYPROGESTERONE ACETATE 150 MG/ML IM SUSP
150.0000 mg | Freq: Once | INTRAMUSCULAR | Status: AC
  Administered 2020-02-04: 150 mg via INTRAMUSCULAR

## 2020-02-04 NOTE — Progress Notes (Signed)
Per orders of NP Nche, an injection of Medroxyprogesterone Acetate given in LUQ ventrogluteal by Robert Bellow, RN. Patient tolerated injection well.

## 2020-02-09 NOTE — Progress Notes (Signed)
Medical screening examination/treatment/procedure(s) were performed by the RN. As primary care provider I was immediately available for consulation/collaboration. I agree with above documentation. Makaelyn Aponte, AGNP-C 

## 2020-02-12 ENCOUNTER — Other Ambulatory Visit: Payer: Self-pay | Admitting: Nurse Practitioner

## 2020-02-12 DIAGNOSIS — F411 Generalized anxiety disorder: Secondary | ICD-10-CM

## 2020-02-12 DIAGNOSIS — F324 Major depressive disorder, single episode, in partial remission: Secondary | ICD-10-CM

## 2020-03-08 ENCOUNTER — Other Ambulatory Visit: Payer: Self-pay | Admitting: Nurse Practitioner

## 2020-04-18 ENCOUNTER — Other Ambulatory Visit: Payer: Self-pay

## 2020-04-19 ENCOUNTER — Ambulatory Visit (INDEPENDENT_AMBULATORY_CARE_PROVIDER_SITE_OTHER): Payer: BC Managed Care – PPO

## 2020-04-19 DIAGNOSIS — Z3042 Encounter for surveillance of injectable contraceptive: Secondary | ICD-10-CM | POA: Diagnosis not present

## 2020-04-19 MED ORDER — MEDROXYPROGESTERONE ACETATE 150 MG/ML IM SUSP
150.0000 mg | Freq: Once | INTRAMUSCULAR | Status: AC
  Administered 2020-04-19: 150 mg via INTRAMUSCULAR

## 2020-04-19 NOTE — Progress Notes (Signed)
After obtaining consent, and per orders of [facility], injection of Depo- Provera in LUQ given by [place]. Patient instructed to remain in clinic for 20 minutes afterwards, and to report any adverse reaction to me immediately.

## 2020-04-20 NOTE — Progress Notes (Signed)
Medical screening examination/treatment/procedure(s) were performed by the CMA. As primary care provider I was immediately available for consulation/collaboration. I agree with above documentation. Makaylin Carlo, AGNP-C 

## 2020-04-21 ENCOUNTER — Ambulatory Visit: Payer: BC Managed Care – PPO

## 2020-05-12 ENCOUNTER — Other Ambulatory Visit: Payer: Self-pay

## 2020-05-12 NOTE — Telephone Encounter (Signed)
Patient calling for a refill for medication, Fluoxetine 20mg .   Last OV 03/24/21w/Nche 11/17/19 w/K[facility] Please see message and advise.  Thank you.

## 2020-05-13 MED ORDER — FLUOXETINE HCL 20 MG PO CAPS: 20.0000 mg | ORAL_CAPSULE | Freq: Every day | ORAL | 0 refills | Status: DC

## 2020-07-05 ENCOUNTER — Other Ambulatory Visit: Payer: Self-pay

## 2020-07-05 ENCOUNTER — Ambulatory Visit (INDEPENDENT_AMBULATORY_CARE_PROVIDER_SITE_OTHER): Payer: BC Managed Care – PPO

## 2020-07-05 DIAGNOSIS — Z3042 Encounter for surveillance of injectable contraceptive: Secondary | ICD-10-CM

## 2020-07-05 MED ORDER — MEDROXYPROGESTERONE ACETATE 150 MG/ML IM SUSP
150.0000 mg | Freq: Once | INTRAMUSCULAR | Status: AC
  Administered 2020-07-05: 150 mg via INTRAMUSCULAR

## 2020-07-05 NOTE — Progress Notes (Signed)
Per orders of NP Nche, pt is here for depo injection 150mg  given right upper outter quadrant, pt tolerated injection well and is to return in 3 months between Apr. 19th and May 3rd 2022 for next injection

## 2020-08-01 ENCOUNTER — Ambulatory Visit: Payer: BC Managed Care – PPO | Admitting: Nurse Practitioner

## 2020-08-11 ENCOUNTER — Other Ambulatory Visit: Payer: Self-pay

## 2020-08-12 ENCOUNTER — Encounter: Payer: Self-pay | Admitting: Nurse Practitioner

## 2020-08-12 ENCOUNTER — Ambulatory Visit: Payer: BC Managed Care – PPO | Admitting: Nurse Practitioner

## 2020-08-12 VITALS — BP 116/80 | HR 88 | Temp 98.2°F | Ht 63.0 in | Wt 206.4 lb

## 2020-08-12 DIAGNOSIS — F324 Major depressive disorder, single episode, in partial remission: Secondary | ICD-10-CM

## 2020-08-12 DIAGNOSIS — L729 Follicular cyst of the skin and subcutaneous tissue, unspecified: Secondary | ICD-10-CM

## 2020-08-12 DIAGNOSIS — Z6836 Body mass index (BMI) 36.0-36.9, adult: Secondary | ICD-10-CM

## 2020-08-12 DIAGNOSIS — R7301 Impaired fasting glucose: Secondary | ICD-10-CM | POA: Insufficient documentation

## 2020-08-12 DIAGNOSIS — R739 Hyperglycemia, unspecified: Secondary | ICD-10-CM

## 2020-08-12 DIAGNOSIS — E66812 Morbid (severe) obesity due to excess calories: Secondary | ICD-10-CM

## 2020-08-12 HISTORY — DX: Follicular cyst of the skin and subcutaneous tissue, unspecified: L72.9

## 2020-08-12 LAB — POCT GLYCOSYLATED HEMOGLOBIN (HGB A1C): Hemoglobin A1C: 5.7 % — AB (ref 4.0–5.6)

## 2020-08-12 MED ORDER — FLUOXETINE HCL 20 MG PO CAPS: 20.0000 mg | ORAL_CAPSULE | Freq: Every day | ORAL | 3 refills | Status: DC

## 2020-08-12 NOTE — Assessment & Plan Note (Signed)
" >>  ASSESSMENT AND PLAN FOR DEPRESSION, RECURRENT WRITTEN ON 08/12/2020 12:14 PM BY Kayslee Furey LUM, NP  stable mood with fluoxetine  Maintain current medication "

## 2020-08-12 NOTE — Assessment & Plan Note (Signed)
stable mood with fluoxetine Maintain current medication

## 2020-08-12 NOTE — Patient Instructions (Addendum)
Your HgbA1c is 5.7% which means you are at risk of developing diabetes. Maintaining a heart heathy diet, regular exercise will help promote weight loss and prevent complications like diabetes, hypertension, arthritis and heart disease.  Skin cyst are benign.  Calorie Counting for Weight Loss Calories are units of energy. Your body needs a certain number of calories from food to keep going throughout the day. When you eat or drink more calories than your body needs, your body stores the extra calories mostly as fat. When you eat or drink fewer calories than your body needs, your body burns fat to get the energy it needs. Calorie counting means keeping track of how many calories you eat and drink each day. Calorie counting can be helpful if you need to lose weight. If you eat fewer calories than your body needs, you should lose weight. Ask your health care provider what a healthy weight is for you. For calorie counting to work, you will need to eat the right number of calories each day to lose a healthy amount of weight per week. A dietitian can help you figure out how many calories you need in a day and will suggest ways to reach your calorie goal.  A healthy amount of weight to lose each week is usually 1-2 lb (0.5-0.9 kg). This usually means that your daily calorie intake should be reduced by 500-750 calories.  Eating 1,200-1,500 calories a day can help most women lose weight.  Eating 1,500-1,800 calories a day can help most men lose weight. What do I need to know about calorie counting? Work with your health care provider or dietitian to determine how many calories you should get each day. To meet your daily calorie goal, you will need to:  Find out how many calories are in each food that you would like to eat. Try to do this before you eat.  Decide how much of the food you plan to eat.  Keep a food log. Do this by writing down what you ate and how many calories it had. To successfully lose  weight, it is important to balance calorie counting with a healthy lifestyle that includes regular activity. Where do I find calorie information? The number of calories in a food can be found on a Nutrition Facts label. If a food does not have a Nutrition Facts label, try to look up the calories online or ask your dietitian for help. Remember that calories are listed per serving. If you choose to have more than one serving of a food, you will have to multiply the calories per serving by the number of servings you plan to eat. For example, the label on a package of bread might say that a serving size is 1 slice and that there are 90 calories in a serving. If you eat 1 slice, you will have eaten 90 calories. If you eat 2 slices, you will have eaten 180 calories.   How do I keep a food log? After each time that you eat, record the following in your food log as soon as possible:  What you ate. Be sure to include toppings, sauces, and other extras on the food.  How much you ate. This can be measured in cups, ounces, or number of items.  How many calories were in each food and drink.  The total number of calories in the food you ate. Keep your food log near you, such as in a pocket-sized notebook or on an app or website  on your mobile phone. Some programs will calculate calories for you and show you how many calories you have left to meet your daily goal. What are some portion-control tips?  Know how many calories are in a serving. This will help you know how many servings you can have of a certain food.  Use a measuring cup to measure serving sizes. You could also try weighing out portions on a kitchen scale. With time, you will be able to estimate serving sizes for some foods.  Take time to put servings of different foods on your favorite plates or in your favorite bowls and cups so you know what a serving looks like.  Try not to eat straight from a food's packaging, such as from a bag or box.  Eating straight from the package makes it hard to see how much you are eating and can lead to overeating. Put the amount you would like to eat in a cup or on a plate to make sure you are eating the right portion.  Use smaller plates, glasses, and bowls for smaller portions and to prevent overeating.  Try not to multitask. For example, avoid watching TV or using your computer while eating. If it is time to eat, sit down at a table and enjoy your food. This will help you recognize when you are full. It will also help you be more mindful of what and how much you are eating. What are tips for following this plan? Reading food labels  Check the calorie count compared with the serving size. The serving size may be smaller than what you are used to eating.  Check the source of the calories. Try to choose foods that are high in protein, fiber, and vitamins, and low in saturated fat, trans fat, and sodium. Shopping  Read nutrition labels while you shop. This will help you make healthy decisions about which foods to buy.  Pay attention to nutrition labels for low-fat or fat-free foods. These foods sometimes have the same number of calories or more calories than the full-fat versions. They also often have added sugar, starch, or salt to make up for flavor that was removed with the fat.  Make a grocery list of lower-calorie foods and stick to it. Cooking  Try to cook your favorite foods in a healthier way. For example, try baking instead of frying.  Use low-fat dairy products. Meal planning  Use more fruits and vegetables. One-half of your plate should be fruits and vegetables.  Include lean proteins, such as chicken, [place], and fish. Lifestyle Each week, aim to do one of the following:  150 minutes of moderate exercise, such as walking.  75 minutes of vigorous exercise, such as running. General information  Know how many calories are in the foods you eat most often. This will help you  calculate calorie counts faster.  Find a way of tracking calories that works for you. Get creative. Try different apps or programs if writing down calories does not work for you. What foods should I eat?  Eat nutritious foods. It is better to have a nutritious, high-calorie food, such as an avocado, than a food with few nutrients, such as a bag of potato chips.  Use your calories on foods and drinks that will fill you up and will not leave you hungry soon after eating. ? Examples of foods that fill you up are nuts and nut butters, vegetables, lean proteins, and high-fiber foods such as whole grains. High-fiber foods are  foods with more than 5 g of fiber per serving.  Pay attention to calories in drinks. Low-calorie drinks include water and unsweetened drinks. The items listed above may not be a complete list of foods and beverages you can eat. Contact a dietitian for more information.   What foods should I limit? Limit foods or drinks that are not good sources of vitamins, minerals, or protein or that are high in unhealthy fats. These include:  Candy.  Other sweets.  Sodas, specialty coffee drinks, alcohol, and juice. The items listed above may not be a complete list of foods and beverages you should avoid. Contact a dietitian for more information. How do I count calories when eating out?  Pay attention to portions. Often, portions are much larger when eating out. Try these tips to keep portions smaller: ? Consider sharing a meal instead of getting your own. ? If you get your own meal, eat only half of it. Before you start eating, ask for a container and put half of your meal into it. ? When available, consider ordering smaller portions from the menu instead of full portions.  Pay attention to your food and drink choices. Knowing the way food is cooked and what is included with the meal can help you eat fewer calories. ? If calories are listed on the menu, choose the lower-calorie  options. ? Choose dishes that include vegetables, fruits, whole grains, low-fat dairy products, and lean proteins. ? Choose items that are boiled, broiled, grilled, or steamed. Avoid items that are buttered, battered, fried, or served with cream sauce. Items labeled as crispy are usually fried, unless stated otherwise. ? Choose water, low-fat milk, unsweetened iced tea, or other drinks without added sugar. If you want an alcoholic beverage, choose a lower-calorie option, such as a glass of wine or light beer. ? Ask for dressings, sauces, and syrups on the side. These are usually high in calories, so you should limit the amount you eat. ? If you want a salad, choose a garden salad and ask for grilled meats. Avoid extra toppings such as bacon, cheese, or fried items. Ask for the dressing on the side, or ask for olive oil and vinegar or lemon to use as dressing.  Estimate how many servings of a food you are given. Knowing serving sizes will help you be aware of how much food you are eating at restaurants. Where to find more information  Centers for Disease Control and Prevention: FootballExhibition.com.br  [place] Department of Agriculture: WrestlingReporter.dk Summary  Calorie counting means keeping track of how many calories you eat and drink each day. If you eat fewer calories than your body needs, you should lose weight.  A healthy amount of weight to lose per week is usually 1-2 lb (0.5-0.9 kg). This usually means reducing your daily calorie intake by 500-750 calories.  The number of calories in a food can be found on a Nutrition Facts label. If a food does not have a Nutrition Facts label, try to look up the calories online or ask your dietitian for help.  Use smaller plates, glasses, and bowls for smaller portions and to prevent overeating.  Use your calories on foods and drinks that will fill you up and not leave you hungry shortly after a meal. This information is not intended to replace advice given to you by  your health care provider. Make sure you discuss any questions you have with your health care provider. Document Revised: 07/02/2019 Document Reviewed: 07/02/2019 Elsevier Patient  Education  2021 Elsevier Inc.  

## 2020-08-12 NOTE — Assessment & Plan Note (Addendum)
With FHx of DM, she is concerned about her risk of developing DM. No polyphagia or polyuria or polydipsia or change in skin or paresthesia or nausea or weight fluctuation. Last fasting glucose of 100 HgbA1c today if 5.7%: advised about need for  heart heathy diet, regular exercise will help promote weight loss and prevent complications like diabetes, hypertension, arthritis and heart disease. Repeat in 107months

## 2020-08-12 NOTE — Progress Notes (Signed)
Subjective:  Patient ID: Kristin Spencer, female    DOB: 1995-09-08  Age: 25 y.o. MRN: [id number]  CC: Acute Visit (Pt c/o bumps under her arms and clavicle for years but states they are now causing pain. Pt state the ones under her arm causes pain when she lays on them. )  Rash This is a chronic problem. The problem is unchanged. The affected locations include the left axilla, right axilla and torso. She was exposed to nothing. Past treatments include nothing.   Depression screen [facility] 2/9 08/12/2020 12/31/2018 12/31/2018  Decreased Interest 1 1 2   Down, Depressed, Hopeless 1 1 1   PHQ - 2 Score 2 2 3   Altered sleeping 0 0 1  Tired, decreased energy 2 1 0  Change in appetite 2 1 0  Feeling bad or failure about yourself  0 0 -  Trouble concentrating 0 1 -  Moving slowly or fidgety/restless 0 0 -  Suicidal thoughts 0 0 -  PHQ-9 Score 6 5 4   Difficult doing work/chores Somewhat difficult - -   GAD 7 : Generalized Anxiety Score 08/12/2020 12/31/2018 12/31/2018 07/29/2018  Nervous, Anxious, on Edge 2 2 2 3   Control/stop worrying 1 1 1 2   Worry too much - different things 1 1 1 3   Trouble relaxing 0 0 0 2  Restless 0 0 0 1  Easily annoyed or irritable 1 1 1 3   Afraid - awful might happen 0 1 1 1   Total GAD 7 Score 5 6 6 15   Anxiety Difficulty Somewhat difficult - - -   Depression, major, single episode, complete remission (HCC) stable mood with fluoxetine Maintain current medication  Hyperglycemia With FHx of DM, she is concerned about her risk of developing DM. No polyphagia or polyuria or polydipsia or change in skin or paresthesia or nausea or weight fluctuation. Last fasting glucose of 100 HgbA1c today if 5.7%: advised about need for  heart heathy diet, regular exercise will help promote weight loss and prevent complications like diabetes, hypertension, arthritis and heart disease. Repeat in 2months  Wt Readings from Last 3 Encounters:  08/12/20 206 lb 6.4 oz (93.6 kg)   11/17/19 192 lb 6.4 oz (87.3 kg)  11/03/19 187 lb 9.6 oz (85.1 kg)    Reviewed past Medical, Social and Family history today.  Outpatient Medications Prior to Visit  Medication Sig Dispense Refill  . azelastine (ASTELIN) 0.1 % nasal spray 2 sprays per nostril twice a day if needed for sinus headache 30 mL 5  . Cetirizine HCl (ZYRTEC PO) Take by mouth.    . famotidine (PEPCID) 20 MG tablet Take 1 tablet twice a day if needed for heartburn 64 tablet 5  . fluticasone (FLONASE) 50 MCG/ACT nasal spray 2 sprays per nostril once a day if needed for stuffy nose 16 g 5  . FLUoxetine (PROZAC) 20 MG capsule Take 1 capsule (20 mg total) by mouth daily. Need office visit for additional refills 90 capsule 0  . diclofenac sodium (VOLTAREN) 1 % GEL Apply 2 g topically 3 (three) times daily. (Patient not taking: No sig reported) 50 g 1  . HYDROcodone-acetaminophen (NORCO/VICODIN) 5-325 MG tablet Take 1 tablet by mouth every 4 (four) hours as needed. (Patient not taking: Reported on 08/12/2020) 10 tablet 0  . methocarbamol (ROBAXIN) 500 MG tablet Take 1 tablet (500 mg total) by mouth every 8 (eight) hours as needed for muscle spasms. (Patient not taking: Reported on 08/12/2020) 40 tablet 0   No facility-administered  medications prior to visit.    ROS See HPI  Objective:  BP 116/80 (BP Location: Left Arm, Patient Position: Sitting, Cuff Size: Normal)   Pulse 88   Temp 98.2 F (36.8 C) (Temporal)   Ht 5\' 3"  (1.6 m)   Wt 206 lb 6.4 oz (93.6 kg)   SpO2 99%   BMI 36.56 kg/m   Physical Exam Skin:    Findings: Rash present. Rash is nodular.          Comments: Small subcutaneous nodules: mobile, non tender, no erythema, no skin retraction. No axilla or supraclavicular lymphadenopathy     Assessment & Plan:  This visit occurred during the SARS-CoV-2 public health emergency.  Safety protocols were in place, including screening questions prior to the visit, additional usage of staff PPE, and  extensive cleaning of exam room while observing appropriate contact time as indicated for disinfecting solutions.   Doria was seen today for acute visit.  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  Class 2 severe obesity with serious comorbidity and body mass index (BMI) of 36.0 to 36.9 in adult, unspecified obesity type (HCC) -     POCT glycosylated hemoglobin (Hb A1C)  Hyperglycemia -     POCT glycosylated hemoglobin (Hb A1C)    Problem List Items Addressed This Visit      Other   Depression, major, single episode, complete remission (HCC)    stable mood with fluoxetine Maintain current medication      Relevant Medications   FLUoxetine (PROZAC) 20 MG capsule   Hyperglycemia    With FHx of DM, she is concerned about her risk of developing DM. No polyphagia or polyuria or polydipsia or change in skin or paresthesia or nausea or weight fluctuation. Last fasting glucose of 100 HgbA1c today if 5.7%: advised about need for  heart heathy diet, regular exercise will help promote weight loss and prevent complications like diabetes, hypertension, arthritis and heart disease. 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  01/12/2021, NP

## 2020-09-20 ENCOUNTER — Ambulatory Visit: Payer: BC Managed Care – PPO

## 2020-09-21 ENCOUNTER — Other Ambulatory Visit: Payer: Self-pay

## 2020-09-21 ENCOUNTER — Telehealth: Payer: Self-pay | Admitting: Nurse Practitioner

## 2020-09-21 ENCOUNTER — Ambulatory Visit (INDEPENDENT_AMBULATORY_CARE_PROVIDER_SITE_OTHER): Payer: BC Managed Care – PPO

## 2020-09-21 DIAGNOSIS — Z3042 Encounter for surveillance of injectable contraceptive: Secondary | ICD-10-CM | POA: Diagnosis not present

## 2020-09-21 MED ORDER — MEDROXYPROGESTERONE ACETATE 150 MG/ML IM SUSP
150.0000 mg | Freq: Once | INTRAMUSCULAR | Status: AC
  Administered 2020-09-21: 150 mg via INTRAMUSCULAR

## 2020-09-21 NOTE — Telephone Encounter (Signed)
See note

## 2020-09-21 NOTE — Progress Notes (Signed)
Per orders of NP [facility], pt is here for depo-provera injection. Injection was given in left upper outer quadrant, pt tolerated well.  pt is to return around Jul. 6th-Jul. 20th for next injection.

## 2020-11-14 ENCOUNTER — Encounter: Payer: Self-pay | Admitting: Nurse Practitioner

## 2020-11-14 ENCOUNTER — Other Ambulatory Visit: Payer: Self-pay

## 2020-11-14 ENCOUNTER — Other Ambulatory Visit (HOSPITAL_COMMUNITY)
Admission: RE | Admit: 2020-11-14 | Discharge: 2020-11-14 | Disposition: A | Discharge status: Home / Self Care | Payer: BC Managed Care – PPO | Source: Ambulatory Visit | Attending: Nurse Practitioner | Admitting: Nurse Practitioner

## 2020-11-14 ENCOUNTER — Ambulatory Visit (INDEPENDENT_AMBULATORY_CARE_PROVIDER_SITE_OTHER): Payer: BC Managed Care – PPO | Admitting: Nurse Practitioner

## 2020-11-14 VITALS — BP 114/64 | HR 89 | Temp 99.4°F | Ht 63.0 in | Wt 205.4 lb

## 2020-11-14 DIAGNOSIS — Z23 Encounter for immunization: Secondary | ICD-10-CM

## 2020-11-14 DIAGNOSIS — Z124 Encounter for screening for malignant neoplasm of cervix: Secondary | ICD-10-CM | POA: Diagnosis present

## 2020-11-14 DIAGNOSIS — Z3009 Encounter for other general counseling and advice on contraception: Secondary | ICD-10-CM | POA: Diagnosis not present

## 2020-11-14 DIAGNOSIS — A749 Chlamydial infection, unspecified: Secondary | ICD-10-CM

## 2020-11-14 DIAGNOSIS — Z0000 Encounter for general adult medical examination without abnormal findings: Secondary | ICD-10-CM | POA: Diagnosis not present

## 2020-11-14 LAB — COMPREHENSIVE METABOLIC PANEL
ALT: 27 U/L (ref 0–35)
AST: 30 U/L (ref 0–37)
Albumin: 4.5 g/dL (ref 3.5–5.2)
Alkaline Phosphatase: 90 U/L (ref 39–117)
BUN: 7 mg/dL (ref 6–23)
CO2: 23 mEq/L (ref 19–32)
Calcium: 9.4 mg/dL (ref 8.4–10.5)
Chloride: 108 mEq/L (ref 96–112)
Creatinine, Ser: 0.68 mg/dL (ref 0.40–1.20)
GFR: 122.65 mL/min (ref >60.00)
Glucose, Bld: 89 mg/dL (ref 70–99)
Potassium: 4.4 mEq/L (ref 3.5–5.1)
Sodium: 140 mEq/L (ref 135–145)
Total Bilirubin: 0.3 mg/dL (ref 0.2–1.2)
Total Protein: 7.1 g/dL (ref 6.0–8.3)

## 2020-11-14 LAB — CBC WITH DIFFERENTIAL/PLATELET
Basophils Absolute: 0 10*3/uL (ref 0.0–0.1)
Basophils Relative: 0.6 % (ref 0.0–3.0)
Eosinophils Absolute: 0.2 10*3/uL (ref 0.0–0.7)
Eosinophils Relative: 3.1 % (ref 0.0–5.0)
HCT: 39.8 % (ref 36.0–46.0)
Hemoglobin: 13.4 g/dL (ref 12.0–15.0)
Lymphocytes Relative: 43.2 % (ref 12.0–46.0)
Lymphs Abs: 2.3 10*3/uL (ref 0.7–4.0)
MCHC: 33.6 g/dL (ref 30.0–36.0)
MCV: 79.3 fl (ref 78.0–100.0)
Monocytes Absolute: 0.4 10*3/uL (ref 0.1–1.0)
Monocytes Relative: 6.7 % (ref 3.0–12.0)
Neutro Abs: 2.5 10*3/uL (ref 1.4–7.7)
Neutrophils Relative %: 46.4 % (ref 43.0–77.0)
Platelets: 263 10*3/uL (ref 150.0–400.0)
RBC: 5.01 Mil/uL (ref 3.87–5.11)
RDW: 15 % (ref 11.5–15.5)
WBC: 5.4 10*3/uL (ref 4.0–10.5)

## 2020-11-14 LAB — TSH: TSH: 2.6 u[IU]/mL (ref 0.35–4.50)

## 2020-11-14 NOTE — Addendum Note (Signed)
Addended by: Becky Sax on: 11/14/2020 11:15 AM   Modules accepted: Orders

## 2020-11-14 NOTE — Patient Instructions (Signed)
Go to lab for blood draw You will be contacted to schedule appt with GYN. Schedule nurse visit for 3rd HPV vaccine in 35month.  Preventive Care [age] Old, Female Preventive care refers to lifestyle choices and visits with your health care provider that can promote health and wellness. This includes: A yearly physical exam. This is also called an annual wellness visit. Regular dental and eye exams. Immunizations. Screening for certain conditions. Healthy lifestyle choices, such as: Eating a healthy diet. Getting regular exercise. Not using drugs or products that contain nicotine and tobacco. Limiting alcohol use. What can I expect for my preventive care visit? Physical exam Your health care provider may check your: Height and weight. These may be used to calculate your BMI (body mass index). BMI is a measurement that tells if you are at a healthy weight. Heart rate and blood pressure. Body temperature. Skin for abnormal spots. Counseling Your health care provider may ask you questions about your: Past medical problems. Family's medical history. Alcohol, tobacco, and drug use. Emotional well-being. Home life and relationship well-being. Sexual activity. Diet, exercise, and sleep habits. Work and work [employer] Access to firearms. Method of birth control. Menstrual cycle. Pregnancy history. What immunizations do I need?  Vaccines are usually given at various ages, according to a schedule. Your health care provider will recommend vaccines for you based on your age, medicalhistory, and lifestyle or other factors, such as travel or where you work. What tests do I need?  Blood tests Lipid and cholesterol levels. These may be checked every 5 years starting at age 24 Hepatitis C test. Hepatitis B test. Screening Diabetes screening. This is done by checking your blood sugar (glucose) after you have not eaten for a while (fasting). STD (sexually transmitted disease)  testing, if you are at risk. BRCA-related cancer screening. This may be done if you have a family history of breast, ovarian, tubal, or peritoneal cancers. Pelvic exam and Pap test. This may be done every 3 years starting at age 404 Starting at age 24 this may be done every 5 years if you have a Pap test in combination with an HPV test. Talk with your health care provider about your test results, treatment options,and if necessary, the need for more tests. Follow these instructions at home: Eating and drinking  Eat a healthy diet that includes fresh fruits and vegetables, whole grains, lean protein, and low-fat dairy products. Take vitamin and mineral supplements as recommended by your health care provider. Do not drink alcohol if: Your health care provider tells you not to drink. You are pregnant, may be pregnant, or are planning to become pregnant. If you drink alcohol: Limit how much you have to 0-1 drink a day. Be aware of how much alcohol is in your drink. In the [place] one drink equals one 12 oz bottle of beer (355 mL), one 5 oz glass of wine (148 mL), or one 1 oz glass of hard liquor (44 mL).  Lifestyle Take daily care of your teeth and gums. Brush your teeth every morning and night with fluoride toothpaste. Floss one time each day. Stay active. Exercise for at least 30 minutes 5 or more days each week. Do not use any products that contain nicotine or tobacco, such as cigarettes, e-cigarettes, and chewing tobacco. If you need help quitting, ask your health care provider. Do not use drugs. If you are sexually active, practice safe sex. Use a condom or other form of protection to prevent STIs (sexually transmitted  infections). If you do not wish to become pregnant, use a form of birth control. If you plan to become pregnant, see your health care provider for a prepregnancy visit. Find healthy ways to cope with stress, such as: Meditation, yoga, or listening to  music. Journaling. Talking to a trusted person. Spending time with friends and family. Safety Always wear your seat belt while driving or riding in a vehicle. Do not drive: If you have been drinking alcohol. Do not ride with someone who has been drinking. When you are tired or distracted. While texting. Wear a helmet and other protective equipment during sports activities. If you have firearms in your house, make sure you follow all gun safety procedures. Seek help if you have been physically or sexually abused. What's next? Go to your health care provider once a year for an annual wellness visit. Ask your health care provider how often you should have your eyes and teeth checked. Stay up to date on all vaccines. This information is not intended to replace advice given to you by your health care provider. Make sure you discuss any questions you have with your healthcare provider. Document Revised: 01/17/2020 Document Reviewed: 01/30/2018 Elsevier Patient Education  2022 [employer].

## 2020-11-14 NOTE — Progress Notes (Signed)
Subjective:    Patient ID: Kristin Spencer, female    DOB: Aug 08, 1995, 25 y.o.   MRN: [id number]  Patient presents today for CPE   HPI Denies any acute complaint  Sexual History (orientation,birth control, marital status, STD):sexually active, female partner, use of condoms and depoprovera injection. Wants to switch to nexplanon  Depression/Suicide: Depression screen [facility] 2/9 11/14/2020 08/12/2020 12/31/2018 12/31/2018 07/29/2018 07/29/2018  Decreased Interest 0 1 1 2 2 2   Down, Depressed, Hopeless 1 1 1 1 2 2   PHQ - 2 Score 1 2 2 3 4 4   Altered sleeping 0 0 0 1 2 -  Tired, decreased energy 1 2 1  0 3 -  Change in appetite 1 2 1  0 2 -  Feeling bad or failure about yourself  0 0 0 - 1 -  Trouble concentrating 0 0 1 - 1 -  Moving slowly or fidgety/restless 0 0 0 - 1 -  Suicidal thoughts 0 0 0 - 0 -  PHQ-9 Score 3 6 5 4 14  -  Difficult doing work/chores Not difficult at all Somewhat difficult - - - -   Vision:will schedule  Dental:will schedule  Immunizations: (TDAP, Hep C screen, Pneumovax, Influenza, zoster) Agreed to complete HPV vaccine series Health Maintenance  Topic Date Due   HPV Vaccine (2 - 3-dose series) 11/04/2015   COVID-19 Vaccine (3 - Booster for Pfizer series) 02/08/2020   Hepatitis C Screening: USPSTF Recommendation to screen - Ages 18-79 yo.  11/14/2021*   Flu Shot  01/02/2021   Pap Smear  12/30/2021   Pap Smear  12/30/2021   Tetanus Vaccine  10/06/2025   Zoster (Shingles) Vaccine (1 of 2) 05/01/2047   HIV Screening  Completed   Pneumococcal Vaccination  Aged Out  *Topic was postponed. The date shown is not the original due date.   Diet:regular Exercise none Weight:  Wt Readings from Last 3 Encounters:  11/14/20 205 lb 6.4 oz (93.2 kg)  08/12/20 206 lb 6.4 oz (93.6 kg)  11/17/19 192 lb 6.4 oz (87.3 kg)    Medications and allergies reviewed with patient and updated if appropriate.  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 08/12/2020   Contusion of left knee 11/17/2019   Contusion of left forearm 11/03/2019   Cervical strain 11/03/2019   Strain of lumbar region 11/03/2019   Allergic rhinitis 08/26/2019   Tendonitis of wrist, left 01/20/2019   Patellofemoral pain syndrome of left knee 01/20/2019   Encounter for surveillance of injectable contraceptive 12/31/2018   Generalized anxiety disorder 11/18/2015   Depression, major, single episode, complete remission (HCC) 11/18/2015   Carpal tunnel syndrome of left wrist 10/09/2015    Current Outpatient Medications on File Prior to Visit  Medication Sig Dispense Refill   azelastine (ASTELIN) 0.1 % nasal spray 2 sprays per nostril twice a day if needed for sinus headache 30 mL 5   Cetirizine HCl (ZYRTEC PO) Take by mouth.     famotidine (PEPCID) 20 MG tablet Take 1 tablet twice a day if needed for heartburn 64 tablet 5   FLUoxetine (PROZAC) 20 MG capsule Take 1 capsule (20 mg total) by mouth daily. 90 capsule 3   fluticasone (FLONASE) 50 MCG/ACT nasal spray 2 sprays per nostril once a day if needed for stuffy nose 16 g 5   No current facility-administered medications on file prior to visit.    Past Medical  History:  Diagnosis Date   Anxiety    Depression     Past Surgical History:  Procedure Laterality Date   WISDOM TOOTH EXTRACTION      Social History   Socioeconomic History   Marital status: Single    Spouse name: Not on file   Number of children: Not on file   Years of education: Not on file   Highest education level: Not on file  Occupational History   Not on file  Tobacco Use   Smoking status: Never   Smokeless tobacco: Never  Vaping Use   Vaping Use: Never used  Substance and Sexual Activity   Alcohol use: Yes    Comment: occasionally   Drug use: Never   Sexual activity: Yes    Birth control/protection: Injection  Other Topics Concern   Not on file  Social History  Narrative   ** Merged History Encounter **       Social Determinants of Health   Financial Resource Strain: Not on file  Food Insecurity: Not on file  Transportation Needs: Not on file  Physical Activity: Not on file  Stress: Not on file  Social Connections: Not on file    Family History  Problem Relation Age of Onset   Diabetes Father    Allergic rhinitis Father    Food Allergy Father    Diabetes Paternal Uncle    Diabetes Maternal Grandfather    Diabetes Paternal Grandfather    Heart disease Paternal Grandfather    Heart attack Paternal Grandfather    Diabetes Paternal Uncle    Allergic rhinitis Sister    Angioedema Neg Hx    Asthma Neg Hx    Eczema Neg Hx    Immunodeficiency Neg Hx    Urticaria Neg Hx         Review of Systems  Constitutional:  Negative for fever, malaise/fatigue and weight loss.  HENT:  Negative for congestion and sore throat.   Eyes:        Negative for visual changes  Respiratory:  Negative for cough and shortness of breath.   Cardiovascular:  Negative for chest pain, palpitations and leg swelling.  Gastrointestinal:  Negative for blood in stool, constipation, diarrhea and heartburn.  Genitourinary:  Negative for dysuria, frequency and urgency.  Musculoskeletal:  Negative for falls, joint pain and myalgias.  Skin:  Negative for rash.  Neurological:  Negative for dizziness, sensory change and headaches.  Endo/Heme/Allergies:  Does not bruise/bleed easily.  Psychiatric/Behavioral:  Negative for depression, substance abuse and suicidal ideas. The patient is not nervous/anxious.    Objective:   Vitals:   11/14/20 0958  BP: 114/64  Pulse: 89  Temp: 99.4 F (37.4 C)  SpO2: 97%    Body mass index is 36.38 kg/m.   Physical Examination:  Physical Exam Vitals reviewed. Exam conducted with a chaperone present.  Constitutional:      General: She is not in acute distress.    Appearance: She is obese.  HENT:     Right Ear: Tympanic  membrane, ear canal and external ear normal.     Left Ear: Tympanic membrane, ear canal and external ear normal.  Eyes:     General: No scleral icterus.    Extraocular Movements: Extraocular movements intact.     Conjunctiva/sclera: Conjunctivae normal.  Neck:     Thyroid: No thyromegaly.  Cardiovascular:     Rate and Rhythm: Normal rate and regular rhythm.     Pulses: Normal pulses.  Heart sounds: Normal heart sounds.  Pulmonary:     Effort: Pulmonary effort is normal.     Breath sounds: Normal breath sounds.  Chest:  Breasts:    Breasts are symmetrical.     Right: Normal. No mass, nipple discharge, skin change, axillary adenopathy or supraclavicular adenopathy.     Left: Normal. No mass, nipple discharge, skin change, axillary adenopathy or supraclavicular adenopathy.  Abdominal:     General: Bowel sounds are normal. There is no distension.     Palpations: Abdomen is soft.     Tenderness: There is no abdominal tenderness.     Hernia: There is no hernia in the left inguinal area or right inguinal area.  Genitourinary:    General: Normal vulva.     Labia:        Right: No rash, tenderness or lesion.        Left: No rash, tenderness or lesion.      Vagina: Normal. No vaginal discharge.     Cervix: Discharge, friability and erythema present. No cervical motion tenderness, lesion, cervical bleeding or eversion.     Uterus: Normal.      Adnexa: Right adnexa normal and left adnexa normal.  Musculoskeletal:        General: No tenderness. Normal range of motion.     Cervical back: Normal range of motion and neck supple.     Right lower leg: No edema.     Left lower leg: No edema.  Lymphadenopathy:     Cervical: No cervical adenopathy.     Upper Body:     Right upper body: No supraclavicular, axillary or pectoral adenopathy.     Left upper body: No supraclavicular, axillary or pectoral adenopathy.     Lower Body: No right inguinal adenopathy. No left inguinal adenopathy.   Skin:    General: Skin is warm and dry.  Neurological:     Mental Status: She is alert and oriented to person, place, and time.  Psychiatric:        Mood and Affect: Mood normal.        Behavior: Behavior normal.        Thought Content: Thought content normal.        Judgment: Judgment normal.    ASSESSMENT and PLAN: This visit occurred during the SARS-CoV-2 public health emergency.  Safety protocols were in place, including screening questions prior to the visit, additional usage of staff PPE, and extensive cleaning of exam room while observing appropriate contact time as indicated for disinfecting solutions.   Henessy was seen today for annual exam.  Diagnoses and all orders for this visit:  Preventative health care -     CBC with Differential/Platelet -     Comprehensive metabolic panel -     TSH -     Cytology - PAP( [place])  Encounter for Papanicolaou smear for cervical cancer screening -     Cytology - PAP( [place])  Encounter for other general counseling or advice on contraception -     Ambulatory referral to Gynecology  Maintain daily exercise and DASh diet You will be contacted to schedule appt with GYN to discuss nexplanon insertion. Schedule nurse visit for 3rd HPV vaccine in 60months.   Problem List Items Addressed This Visit   None Visit Diagnoses     Preventative health care    -  Primary   Relevant Orders   CBC with Differential/Platelet   Comprehensive metabolic panel   TSH  Cytology - PAP( [place])   Encounter for Papanicolaou smear for cervical cancer screening       Relevant Orders   Cytology - PAP( [place])   Encounter for other general counseling or advice on contraception       Relevant Orders   Ambulatory referral to Gynecology       Follow up: Return in about 1 year (around 11/14/2021) for CPE (fasting).  Alysia Penna, NP

## 2020-11-17 LAB — CYTOLOGY - PAP
Chlamydia: POSITIVE — AB
Comment: NEGATIVE
Comment: NEGATIVE
Comment: NORMAL
Diagnosis: NEGATIVE
Neisseria Gonorrhea: NEGATIVE
Trichomonas: NEGATIVE

## 2020-11-18 MED ORDER — AZITHROMYCIN 500 MG PO TABS: 1000.0000 mg | ORAL_TABLET | Freq: Once | ORAL | 0 refills | Status: AC

## 2020-11-18 NOTE — Addendum Note (Signed)
Addended by: Michaela Corner on: 11/18/2020 07:25 AM   Modules accepted: Orders

## 2020-11-25 ENCOUNTER — Other Ambulatory Visit: Payer: Self-pay

## 2020-11-25 ENCOUNTER — Other Ambulatory Visit (INDEPENDENT_AMBULATORY_CARE_PROVIDER_SITE_OTHER): Payer: BC Managed Care – PPO

## 2020-11-25 ENCOUNTER — Other Ambulatory Visit (HOSPITAL_COMMUNITY)
Admission: RE | Admit: 2020-11-25 | Discharge: 2020-11-25 | Disposition: A | Discharge status: Home / Self Care | Payer: BC Managed Care – PPO | Source: Ambulatory Visit | Attending: Nurse Practitioner | Admitting: Nurse Practitioner

## 2020-11-25 DIAGNOSIS — Z8619 Personal history of other infectious and parasitic diseases: Secondary | ICD-10-CM | POA: Diagnosis not present

## 2020-11-25 DIAGNOSIS — A749 Chlamydial infection, unspecified: Secondary | ICD-10-CM

## 2020-11-25 DIAGNOSIS — Z113 Encounter for screening for infections with a predominantly sexual mode of transmission: Secondary | ICD-10-CM | POA: Diagnosis not present

## 2020-11-28 LAB — URINE CYTOLOGY ANCILLARY ONLY
Chlamydia: NEGATIVE
Comment: NEGATIVE
Comment: NEGATIVE
Comment: NORMAL
Neisseria Gonorrhea: NEGATIVE
Trichomonas: NEGATIVE

## 2020-11-28 LAB — HIV ANTIBODY (ROUTINE TESTING W REFLEX): HIV 1&2 Ab, 4th Generation: NONREACTIVE

## 2020-11-28 LAB — HEPATITIS C ANTIBODY
Hepatitis C Ab: NONREACTIVE
SIGNAL TO CUT-OFF: 0.01 (ref <1.00)

## 2020-11-28 LAB — RPR: RPR Ser Ql: NONREACTIVE

## 2020-12-08 ENCOUNTER — Other Ambulatory Visit: Payer: Self-pay

## 2020-12-08 ENCOUNTER — Ambulatory Visit (INDEPENDENT_AMBULATORY_CARE_PROVIDER_SITE_OTHER): Payer: BC Managed Care – PPO

## 2020-12-08 DIAGNOSIS — Z309 Encounter for contraceptive management, unspecified: Secondary | ICD-10-CM

## 2020-12-08 MED ORDER — MEDROXYPROGESTERONE ACETATE 150 MG/ML IM SUSP
150.0000 mg | Freq: Once | INTRAMUSCULAR | Status: AC
Start: 2020-12-08 — End: 2020-12-08
  Administered 2020-12-08: 150 mg via INTRAMUSCULAR

## 2020-12-08 NOTE — Progress Notes (Addendum)
Per orders of Alysia Penna, NP injection of Depo-Provera given by Willaim Bane, CMA in right lateral superior gluteal area.  Patient tolerated injection well. No signs or symptoms of a reaction were noted prior to patient leaving the nurse visit. Patient will make appointment for 3 months.
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
## 2020-12-29 ENCOUNTER — Encounter: Payer: BC Managed Care – PPO | Admitting: Nurse Practitioner

## 2021-01-11 ENCOUNTER — Encounter: Payer: Self-pay | Admitting: Nurse Practitioner

## 2021-01-11 ENCOUNTER — Ambulatory Visit: Payer: BC Managed Care – PPO | Admitting: Nurse Practitioner

## 2021-01-11 ENCOUNTER — Other Ambulatory Visit: Payer: Self-pay

## 2021-01-11 VITALS — BP 124/78 | Ht 63.0 in | Wt 204.0 lb

## 2021-01-11 DIAGNOSIS — Z3009 Encounter for other general counseling and advice on contraception: Secondary | ICD-10-CM | POA: Diagnosis not present

## 2021-01-11 DIAGNOSIS — Z01419 Encounter for gynecological examination (general) (routine) without abnormal findings: Secondary | ICD-10-CM | POA: Diagnosis not present

## 2021-01-11 NOTE — Progress Notes (Signed)
   Kristin Spencer Aug 07, 1995 [id number]   History:  25 y.o. G0 presents to establish care and discuss contraception. Amenorrheic on Depo Provera. She is interested in switching to Nexplanon. Last Depo injection 12/08/2020. Normal pap history. Has received 2 doses of Gardasil (last dose 11/14/2020, scheduled with PCP for next dose). Sexually active, negative STD screening 11/14/2020).  Gynecologic History No LMP recorded. Patient has had an injection.   Contraception/Family planning: Depo-Provera injections Sexually active: Yes  Health Maintenance Last Pap: 11/14/2020. Results were: Normal Last mammogram: Not indicated Last colonoscopy: Not indicated Last Dexa: Not indicated  Past medical history, past surgical history, family history and social history were all reviewed and documented in the EPIC chart. 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   ROS:  A ROS was performed and pertinent positives and negatives are included.  Exam:  Vitals:   01/11/21 1514  BP: 124/78  Weight: 204 lb (92.5 kg)  Height: 5\' 3"  (1.6 m)   Body mass index is 36.14 kg/m.  General appearance:  Normal Thyroid:  Symmetrical, normal in size, without palpable masses or nodularity. Respiratory  Auscultation:  Clear without wheezing or rhonchi Cardiovascular  Auscultation:  Regular rate, without rubs, murmurs or gallops  Edema/varicosities:  Not grossly evident Abdominal  Soft,nontender, without masses, guarding or rebound.  Liver/spleen:  No organomegaly noted  Hernia:  None appreciated  Skin  Inspection:  Grossly normal Breasts: Not indicated per guidelines Genitourinary : Not indicated  Assessment/Plan:  25 y.o. G0 to establish care.   Well female exam with routine gynecological exam -Education provided on SBEs, importance of preventative screenings, current guidelines, high calcium diet, regular exercise, safe sex, and multivitamin daily.   Encounter for other general counseling and advice on contraception  - Plan: Insertion of implanon rod. She has been on Depo Povera (last injection 12/08/2020) and she would like to switch to Nexplanon. Discussed insertion process and risks.   Screening for cervical cancer - Normal pap 11/14/2020. Will repeat at 3-year interval per guidelines.  Return in 1 year for annual.   11/16/2020 DNP, 3:42 PM 01/11/2021

## 2021-03-02 ENCOUNTER — Ambulatory Visit (INDEPENDENT_AMBULATORY_CARE_PROVIDER_SITE_OTHER): Payer: BC Managed Care – PPO | Admitting: Nurse Practitioner

## 2021-03-02 ENCOUNTER — Encounter: Payer: Self-pay | Admitting: Nurse Practitioner

## 2021-03-02 ENCOUNTER — Other Ambulatory Visit: Payer: Self-pay

## 2021-03-02 VITALS — BP 134/94

## 2021-03-02 DIAGNOSIS — Z30017 Encounter for initial prescription of implantable subdermal contraceptive: Secondary | ICD-10-CM

## 2021-03-02 MED ORDER — ETONOGESTREL 68 MG ~~LOC~~ IMPL: 68.0000 mg | DRUG_IMPLANT | Freq: Once | SUBCUTANEOUS | Status: DC

## 2021-03-02 NOTE — Progress Notes (Signed)
25 y.o. G0P0000 female presents for Nexplanon insertion.  She has been counseled about alternative types of contraception and has decided this long acting method is the best for her.  Procedure, risks and benefits have all been explained.  She has the following questions today:  None.     LMP:  UNK, depo provera   After all questions were answered, consent was obtained.    Past Medical History:  Diagnosis Date   Anxiety    Depression     Past Surgical History:  Procedure Laterality Date   WISDOM TOOTH EXTRACTION      Current Outpatient Medications on File Prior to Visit  Medication Sig Dispense Refill   azelastine (ASTELIN) 0.1 % nasal spray 2 sprays per nostril twice a day if needed for sinus headache 30 mL 5   Cetirizine HCl (ZYRTEC PO) Take by mouth.     famotidine (PEPCID) 20 MG tablet Take 1 tablet twice a day if needed for heartburn 64 tablet 5   FLUoxetine (PROZAC) 20 MG capsule Take 1 capsule (20 mg total) by mouth daily. 90 capsule 3   fluticasone (FLONASE) 50 MCG/ACT nasal spray 2 sprays per nostril once a day if needed for stuffy nose 16 g 5   medroxyPROGESTERone (DEPO-PROVERA) 150 MG/ML injection Inject 150 mg into the muscle every 3 (three) months.     No current facility-administered medications on file prior to visit.   Allergies  Allergen Reactions   Shellfish Allergy Shortness Of Breath and Nausea And Vomiting    Vitals:   03/02/21 0853  BP: (!) 134/94   Physical Exam  Procedure: Patient placed supine on exam table with her left arm flexed at the elbow. The location for insertion site was identified 8-10 cm from epicondyle and 3-5 cm posterior.  Area cleansed with Hibiclens  x 2.  Insertion site and path of insertion was anesthetized with 1% Lidocaine without epinephrine, 1.5 cc total.  Using Nexplanon device (and after confirming presence of rod in device), skin was pierced and then elevated along insertion path, passing device just under the skin.  Rod  released and device inserted.  Rod palpated easily.  Steri-strips applied and a pressure bandage was place over the site.  Entire procedure performed with sterile technique.  Assessment: Nexplanon insertion.  Plan:  Post procedure instructions reviewed with pt.  Questions answered.  Pt knows to call with any concerns or questions.  Pt is aware removal is due by 3 calendar years from today.

## 2021-03-07 ENCOUNTER — Encounter: Payer: Self-pay | Admitting: Gynecology

## 2021-05-16 ENCOUNTER — Ambulatory Visit (INDEPENDENT_AMBULATORY_CARE_PROVIDER_SITE_OTHER): Payer: BC Managed Care – PPO

## 2021-05-16 ENCOUNTER — Other Ambulatory Visit: Payer: Self-pay

## 2021-05-16 DIAGNOSIS — Z23 Encounter for immunization: Secondary | ICD-10-CM | POA: Diagnosis not present

## 2021-05-16 NOTE — Progress Notes (Signed)
After obtaining consent, and per orders of Dr. Alysia Penna, injection of HPV #3  given by [place]. Patient instructed to remain in clinic for 20 minutes afterwards, and to report any adverse reaction to me immediately.

## 2021-05-23 ENCOUNTER — Encounter: Payer: Self-pay | Admitting: Nurse Practitioner

## 2021-05-23 ENCOUNTER — Other Ambulatory Visit: Payer: Self-pay

## 2021-05-23 ENCOUNTER — Ambulatory Visit: Payer: BC Managed Care – PPO | Admitting: Nurse Practitioner

## 2021-05-23 VITALS — BP 124/80 | HR 84 | Temp 97.8°F | Ht 63.0 in | Wt 201.2 lb

## 2021-05-23 DIAGNOSIS — Z23 Encounter for immunization: Secondary | ICD-10-CM

## 2021-05-23 DIAGNOSIS — F331 Major depressive disorder, recurrent, moderate: Secondary | ICD-10-CM | POA: Diagnosis not present

## 2021-05-23 MED ORDER — FLUOXETINE HCL 40 MG PO CAPS: 40.0000 mg | ORAL_CAPSULE | Freq: Every day | ORAL | 1 refills | Status: DC

## 2021-05-23 NOTE — Progress Notes (Signed)
Subjective:  Patient ID: Kristin Spencer, female    DOB: 07/01/1995  Age: 25 y.o. MRN: [id number]  CC: Acute Visit (Pt states she does not feel like her depression medication is working anymore and would like to discuss a change in dosage or medication. /Flu vaccine given today. )  HPI  Depression, major, single episode, complete remission (HCC) Worsening mood for last 30months No SI or HI or psychosis Current use of fluoxetine  increase dose to 40mg  Agreed to psychology referral F/up in 1month  Depression screen [facility] 2/9 05/23/2021 11/14/2020 08/12/2020  Decreased Interest 3 0 1  Down, Depressed, Hopeless 3 1 1   PHQ - 2 Score 6 1 2   Altered sleeping 2 0 0  Tired, decreased energy 3 1 2   Change in appetite 1 1 2   Feeling bad or failure about yourself  1 0 0  Trouble concentrating 1 0 0  Moving slowly or fidgety/restless 2 0 0  Suicidal thoughts 1 0 0  PHQ-9 Score 17 3 6   Difficult doing work/chores Extremely dIfficult Not difficult at all Somewhat difficult    GAD 7 : Generalized Anxiety Score 05/23/2021 11/14/2020 08/12/2020 12/31/2018  Nervous, Anxious, on Edge 1 2 2 2   Control/stop worrying 0 1 1 1   Worry too much - different things 1 0 1 1  Trouble relaxing 0 1 0 0  Restless 0 0 0 0  Easily annoyed or irritable 1 0 1 1  Afraid - awful might happen 0 1 0 1  Total GAD 7 Score 3 5 5 6   Anxiety Difficulty Somewhat difficult Somewhat difficult Somewhat difficult -      Reviewed past Medical, Social and Family history today.  Outpatient Medications Prior to Visit  Medication Sig Dispense Refill   azelastine (ASTELIN) 0.1 % nasal spray 2 sprays per nostril twice a day if needed for sinus headache 30 mL 5   Cetirizine HCl (ZYRTEC PO) Take by mouth.     famotidine (PEPCID) 20 MG tablet Take 1 tablet twice a day if needed for heartburn 64 tablet 5   fluticasone (FLONASE) 50 MCG/ACT nasal spray 2 sprays per nostril once a day if needed for stuffy nose 16 g 5   FLUoxetine  (PROZAC) 20 MG capsule Take 1 capsule (20 mg total) by mouth daily. 90 capsule 3   Facility-Administered Medications Prior to Visit  Medication Dose Route Frequency Provider Last Rate Last Admin   etonogestrel (NEXPLANON) implant 68 mg  68 mg Subdermal Once Wallace, Tiffany A, NP        ROS See HPI  Objective:  BP 124/80 (BP Location: Left Arm, Patient Position: Sitting, Cuff Size: Large)    Pulse 84    Temp 97.8 F (36.6 C) (Temporal)    Ht 5\' 3"  (1.6 m)    Wt 201 lb 3.2 oz (91.3 kg)    SpO2 99%    BMI 35.64 kg/m   Physical Exam Vitals reviewed.  Cardiovascular:     Rate and Rhythm: Normal rate.     Pulses: Normal pulses.  Pulmonary:     Effort: Pulmonary effort is normal.  Neurological:     Mental Status: She is alert and oriented to person, place, and time.  Psychiatric:        Mood and Affect: Mood normal.        Behavior: Behavior normal.        Thought Content: Thought content normal.   Assessment & Plan:  This visit occurred during  the SARS-CoV-2 public health emergency.  Safety protocols were in place, including screening questions prior to the visit, additional usage of staff PPE, and extensive cleaning of exam room while observing appropriate contact time as indicated for disinfecting solutions.   [name] was seen today for acute visit.  Diagnoses and all orders for this visit:  Moderate episode of recurrent major depressive disorder (HCC) -     FLUoxetine (PROZAC) 40 MG capsule; Take 1 capsule (40 mg total) by mouth daily. -     Ambulatory referral to Psychology  Flu vaccine need -     Flu Vaccine QUAD 6+ mos PF IM (Fluarix Quad PF)    Problem List Items Addressed This Visit       Other   Depression, major, single episode, complete remission ([place]) - Primary    Worsening mood for last 29months No SI or HI or psychosis Current use of fluoxetine  increase dose to 40mg  Agreed to psychology referral F/up in 57month       Relevant Medications   FLUoxetine  (PROZAC) 40 MG capsule   Other Visit Diagnoses     Flu vaccine need       Relevant Orders   Flu Vaccine QUAD 6+ mos PF IM (Fluarix Quad PF) (Completed)       Follow-up: Return in about 4 weeks (around 06/20/2021) for depression (video).  Wilfred Lacy, NP

## 2021-05-23 NOTE — Assessment & Plan Note (Signed)
Worsening mood for last 71months No SI or HI or psychosis Current use of fluoxetine  increase dose to 40mg  Agreed to psychology referral F/up in 88month

## 2021-05-23 NOTE — Assessment & Plan Note (Signed)
" >>  ASSESSMENT AND PLAN FOR DEPRESSION, RECURRENT WRITTEN ON 05/23/2021  4:38 PM BY Langston Summerfield LUM, NP  Worsening mood for last 2months No SI or HI or psychosis Current use of fluoxetine   increase dose to 40mg  Agreed to psychology referral F/up in 58month  "

## 2021-05-23 NOTE — Patient Instructions (Signed)
Increase fluoxetine to 40mg  daily You will be contacted to schedule appt with psychologist.  Managing Depression, Adult Depression is a mental health condition that affects your thoughts, feelings, and actions. Being diagnosed with depression can bring you relief if you did not know why you have felt or behaved a certain way. It could also leave you feeling overwhelmed with uncertainty about your future. Preparing yourself to manage your symptoms can help you feel more positive about your future. How to manage lifestyle changes Managing stress Stress is your body's reaction to life changes and events, both good and bad. Stress can add to your feelings of depression. Learning to manage your stress can help lessen your feelings of depression. Try some of the following approaches to reducing your stress (stress reduction techniques): Listen to music that you enjoy and that inspires you. Try using a meditation app or take a meditation class. Develop a practice that helps you connect with your spiritual self. Walk in nature, pray, or go to a place of worship. Do some deep breathing. To do this, inhale slowly through your nose. Pause at the top of your inhale for a few seconds and then exhale slowly, letting your muscles relax. Practice yoga to help relax and work your muscles. Choose a stress reduction technique that suits your lifestyle and personality. These techniques take time and practice to develop. Set aside 5-15 minutes a day to do them. Therapists can offer training in these techniques. Other things you can do to manage stress include: Keeping a stress diary. Knowing your limits and saying no when you think something is too much. Paying attention to how you react to certain situations. You may not be able to control everything, but you can change your reaction. Adding humor to your life by watching funny films or TV shows. Making time for activities that you enjoy and that relax  you.  Medicines Medicines, such as antidepressants, are often a part of treatment for depression. Talk with your pharmacist or health care provider about all the medicines, supplements, and herbal products that you take, their possible side effects, and what medicines and other products are safe to take together. Make sure to report any side effects you may have to your health care provider. Relationships Your health care provider may suggest family therapy, couples therapy, or individual therapy as part of your treatment. How to recognize changes Everyone responds differently to treatment for depression. As you recover from depression, you may start to: Have more interest in doing activities. Feel less hopeless. Have more energy. Overeat less often, or have a better appetite. Have better mental focus. It is important to recognize if your depression is not getting better or is getting worse. The symptoms you had in the beginning may return, such as: Tiredness (fatigue) or low energy. Eating too much or too little. Sleeping too much or too little. Feeling restless, agitated, or hopeless. Trouble focusing or making decisions. Unexplained physical complaints. Feeling irritable, angry, or aggressive. If you or your family members notice these symptoms coming back, let your health care provider know right away. Follow these instructions at home: Activity  Try to get some form of exercise each day, such as walking, biking, swimming, or lifting weights. Practice stress reduction techniques. Engage your mind by taking a class or doing some volunteer work. Lifestyle Get the right amount and quality of sleep. Cut down on using caffeine, tobacco, alcohol, and other potentially harmful substances. Eat a healthy diet that includes plenty of  vegetables, fruits, whole grains, low-fat dairy products, and lean protein. Do not eat a lot of foods that are high in solid fats, added sugars, or salt  (sodium). General instructions Take over-the-counter and prescription medicines only as told by your health care provider. Keep all follow-up visits as told by your health care provider. This is important. Where to find support Talking to others Friends and family members can be sources of support and guidance. Talk to trusted friends or family members about your condition. Explain your symptoms to them, and let them know that you are working with a health care provider to treat your depression. Tell friends and family members how they also can be helpful. Finances Find appropriate mental health providers that fit with your financial situation. Talk with your health care provider about options to get reduced prices on your medicines. Where to find more information You can find support in your area from: Anxiety and Depression Association of America (ADAA): www.adaa.org Mental Health America: www.mentalhealthamerica.net The [employer] on Mental Illness: www.nami.org Contact a health care provider if: You stop taking your antidepressant medicines, and you have any of these symptoms: Nausea. Headache. Light-headedness. Chills and body aches. Not being able to sleep (insomnia). You or your friends and family think your depression is getting worse. Get help right away if: You have thoughts of hurting yourself or others. If you ever feel like you may hurt yourself or others, or have thoughts about taking your own life, get help right away. Go to your nearest emergency department or: Call your local emergency services (911 in the [place] Call a suicide crisis helpline, such as the National Suicide Prevention Lifeline at 531-734-9848 or 988 in the [place] This is open 24 hours a day in the [place] Text the Crisis Text Line at (226) 542-5678 (in the [place] Summary If you are diagnosed with depression, preparing yourself to manage your symptoms is a good way to feel positive about your future. Work with  your health care provider on a management plan that includes stress reduction techniques, medicines (if applicable), therapy, and healthy lifestyle habits. Keep talking with your health care provider about how your treatment is working. If you have thoughts about taking your own life, call a suicide crisis helpline or text a crisis text line. This information is not intended to replace advice given to you by your health care provider. Make sure you discuss any questions you have with your health care provider. Document Revised: 12/14/2020 Document Reviewed: 04/01/2019 Elsevier Patient Education  2022 [employer].

## 2021-07-05 ENCOUNTER — Ambulatory Visit (INDEPENDENT_AMBULATORY_CARE_PROVIDER_SITE_OTHER): Payer: BC Managed Care – PPO | Admitting: Clinical

## 2021-07-05 ENCOUNTER — Other Ambulatory Visit: Payer: Self-pay

## 2021-07-05 DIAGNOSIS — F331 Major depressive disorder, recurrent, moderate: Secondary | ICD-10-CM | POA: Diagnosis not present

## 2021-07-05 NOTE — Plan of Care (Signed)
Pt will develop healthy coping methods to challenge unwanted thoughts and emotion as evidenced by journaling 3/7 days per week at least 1 entry per day. Pt participated in completion of treatment plan.

## 2021-07-05 NOTE — Progress Notes (Signed)
Comprehensive Clinical Assessment (CCA) Note  07/05/2021 Kristin Spencer [id number]  Chief Complaint:  Chief Complaint  Patient presents with   Depression   Anxiety   Visit Diagnosis: Moderate episode of major depressive disorder    CCA Screening, Triage and Referral (STR)  Patient Reported Information How did you hear about [place]? Primary Care  Referral name: No data recorded Referral phone number: No data recorded  Whom do you see for routine medical problems? No data recorded Practice/Facility Name: No data recorded Practice/Facility Phone Number: No data recorded Name of Contact: No data recorded Contact Number: No data recorded Contact Fax Number: No data recorded Prescriber Name: No data recorded Prescriber Address (if known): No data recorded  What Is the Reason for Your Visit/Call Today? Depression and anxiety  How Long Has This Been Causing You Problems? > than 6 months (Began in high school, started receiving therapy in 2017.)  What Do You Feel Would Help You the Most Today? No data recorded  Have You Recently Been in Any Inpatient Treatment (Hospital/Detox/Crisis Center/28-Day Program)? No (Denies hx)  Name/Location of Program/Hospital:No data recorded How Long Were You There? No data recorded When Were You Discharged? No data recorded  Have You Ever Received Services From [facility] Before? No  Who Do You See at [facility]? No data recorded  Have You Recently Had Any Thoughts About Hurting Yourself? No (Pt says she has passive SI(has thoughts of crashing car), denies any attempts to harm self. Has these thoughts when having increased stress)  Are You Planning to Commit Suicide/Harm Yourself At This time? No   Have you Recently Had Thoughts About [place]? No  Explanation: No data recorded  Have You Used Any Alcohol or Drugs in the Past 24 Hours? Yes  How Long Ago Did You Use Drugs or Alcohol? No data recorded What Did You Use and How  Much? Alcohol, 1 shot liquor   Do You Currently Have a Therapist/Psychiatrist? No  Name of Therapist/Psychiatrist: No data recorded  Have You Been Recently Discharged From Any Office Practice or Programs? No  Explanation of Discharge From Practice/Program: No data recorded    CCA Screening Triage Referral Assessment Type of Contact: Face-to-Face  Is this Initial or Reassessment? No data recorded Date Telepsych consult ordered in [facility]:  No data recorded Time Telepsych consult ordered in [facility]:  No data recorded  Patient Reported Information Reviewed? No data recorded Patient Left Without Being Seen? No data recorded Reason for Not Completing Assessment: No data recorded  Collateral Involvement: No data recorded  Does Patient Have a Arnett? No data recorded Name and Contact of Legal Guardian: No data recorded If Minor and Not Living with Parent(s), Who has Custody? No data recorded Is CPS involved or ever been involved? Never  Is APS involved or ever been involved? Never   Patient Determined To Be At Risk for Harm To Self or Others Based on Review of Patient Reported Information or Presenting Complaint? No  Method: No data recorded Availability of Means: No data recorded Intent: No data recorded Notification Required: No data recorded Additional Information for Danger to Others Potential: No data recorded Additional Comments for Danger to Others Potential: No data recorded Are There Guns or Other Weapons in Your Home? No, denies access Types of Guns/Weapons: No data recorded Are These Weapons Safely Secured?  No data recorded Who Could Verify You Are Able To Have These Secured: No data recorded Do You Have any Outstanding Charges, Pending Court Dates, Parole/Probation? No data recorded Contacted To Inform of Risk of Harm To Self or Others: No data recorded  Location of Assessment: No data recorded  Does Patient Present  under Involuntary Commitment? No data recorded IVC Papers Initial File Date: No data recorded  [place] of Residence: No data recorded  Patient Currently Receiving the Following Services: Medication Management   Determination of Need: Routine (7 days)   Options For Referral: Outpatient Therapy     CCA Biopsychosocial Intake/Chief Complaint:  Depression and anxiety beginning in high school; reports decrease in anxiety sxs. Pt reports sister dx bipolar d/o and anxiety; says mother undiagnosed mood instability.  Current Symptoms/Problems: Passive SI, mood lability, fatigue, low mood   Patient Reported Schizophrenia/Schizoaffective Diagnosis in Past: No   Strengths: Punctual  Preferences: No data recorded Abilities: No data recorded  Type of Services Patient Feels are Needed: Individual therapy   Initial Clinical Notes/Concerns: 2021, last time receiving [facility] therapy [facility]. Pt states dog died of epilepsy in 22-Apr-2021, mourning loss. Reports passive SI, denies attempts to harm self. No AH/VH reported or witnessed. Denies any hx of psychiatric hospitalization or self harming. Pt says she has been prescribed prozac by PCP to manage mood sxs., no side effects reported. Encouraged to call 911, go to closest ED, call national suicide lifeline in the event of an emergency. Request individual therapy 1x month.   Mental Health Symptoms Depression:   Fatigue; Change in energy/activity; Hopelessness; Worthlessness; Irritability   Duration of Depressive symptoms: No data recorded  Mania:   Irritability; Racing thoughts   Anxiety:    Fatigue   Psychosis:   None   Duration of Psychotic symptoms: No data recorded  Trauma:   None   Obsessions:   None   Compulsions:   None   Inattention:   None   Hyperactivity/Impulsivity:   None   Oppositional/Defiant Behaviors:   N/A   Emotional Irregularity:   None   Other Mood/Personality Symptoms:  No data  recorded   Mental Status Exam Appearance and self-care  Stature:   Average   Weight:   Average weight   Clothing:   Casual   Grooming:   Normal   Cosmetic use:   None   Posture/gait:   Normal   Motor activity:   Not Remarkable   Sensorium  Attention:   Normal   Concentration:   Normal   Orientation:   X5   Recall/memory:   Normal   Affect and Mood  Affect:   Appropriate   Mood:   Euthymic   Relating  Eye contact:   Normal   Facial expression:   Responsive   Attitude toward examiner:   Cooperative   Thought and Language  Speech flow:  Clear and Coherent   Thought content:   Appropriate to Mood and Circumstances   Preoccupation:   None   Hallucinations:   None   Organization:  No data recorded  [employer] of Knowledge:   Good   Intelligence:   Average   Abstraction:   Normal   Judgement:   Good   Reality Testing:   Adequate   Insight:   Good   Decision Making:   Normal   Social Functioning  Social Maturity:   Responsible   Social Judgement:   Normal   Stress  Stressors:  Financial; Grief/losses   Coping Ability:   Normal   Skill Deficits:   None   Supports:   Family; Friends/Service system     Religion: Religion/Spirituality Are You A Religious Person?: No  Leisure/Recreation: Leisure / Recreation Do You Have Hobbies?: Yes Leisure and Hobbies: Take naps  Exercise/Diet: Exercise/Diet Do You Exercise?: Yes What Type of Exercise Do You Do?: Run/Walk How Many Times a Week Do You Exercise?: Daily Have You Gained or Lost A Significant Amount of Weight in the Past Six Months?: Yes-Lost Number of Pounds Lost?: 10 Do You Follow a Special Diet?: No Do You Have Any Trouble Sleeping?: No   CCA Employment/Education Employment/Work Situation: Employment / Work Situation Employment Situation: Employed 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, Civil Service, Loss adjuster, 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 How Long has Patient Been Employed?: 9 months Are You Satisfied With Your Job?: Yes Patient's Job has Been Impacted by Current Illness: No Has Patient ever Been in the [employer]?: No  Education: Education Did Physicist, medical?: Yes What Type of College Degree Do you Have?: Bachelors Did Heritage manager?: No Did You Have An Individualized Education Program (IIEP): No Did You Have Any Difficulty At [employer]?: No Patient's Education Has Been Impacted by Current Illness: No   CCA Family/Childhood History Family and Relationship History: Family history Marital status: Single What is your sexual orientation?: Straight Does patient have children?: No  Childhood History:  Childhood History By whom was/is the patient raised?: Father Additional childhood history information: Pt lives with father. Description of patient's relationship with caregiver when they were a child: Parents divorced when pt was age 46. Raised primarily by father, good relationship; spent 1 weekend each month with mother. Patient's description of current relationship with people who raised him/her: Minimal contact with mother Does patient have siblings?: Yes Number of Siblings: 7 Did patient suffer any verbal/emotional/physical/sexual abuse as a child?: No Did patient suffer from severe childhood neglect?: No Has patient ever been sexually abused/assaulted/raped as an adolescent or adult?: No Witnessed domestic violence?: No Has patient been affected by domestic violence as an adult?: No  Child/Adolescent Assessment:     CCA Substance Use Alcohol/Drug Use: Alcohol / Drug Use Pain Medications: see mar Prescriptions: see mar Over the Counter: see mar History of alcohol / drug use?: Yes Negative Consequences of Use:  (Pt denies) Withdrawal Symptoms:  (Pt denies) Substance #1 Name of Substance 1: alcohol 1 - Amount (size/oz): 1-2 shots 1 - Frequency: 1x month 1 - Duration: ongoing 1 -  Last Use / Amount: 1/31 1 - Method of Aquiring: purchase 1- Route of Use: oral                       ASAM's:  Six Dimensions of Multidimensional Assessment  Dimension 1:  Acute Intoxication and/or Withdrawal Potential:      Dimension 2:  Biomedical Conditions and Complications:      Dimension 3:  Emotional, Behavioral, or Cognitive Conditions and Complications:     Dimension 4:  Readiness to Change:     Dimension 5:  Relapse, Continued use, or Continued Problem Potential:     Dimension 6:  Recovery/Living Environment:     ASAM Severity Score:    ASAM Recommended Level of Treatment:     Substance use Disorder (SUD)    Recommendations for Services/Supports/Treatments: Recommendations for Services/Supports/Treatments Recommendations For Services/Supports/Treatments: Individual Therapy  DSM5 Diagnoses: Patient Active Problem List   Diagnosis Date Noted   Hyperglycemia  [id number]   Follicular cyst of skin and subcutaneous tissue 08/12/2020   Contusion of left knee 11/17/2019   Contusion of left forearm 11/03/2019   Cervical strain 11/03/2019   Strain of lumbar region 11/03/2019   Allergic rhinitis 08/26/2019   Tendonitis of wrist, left 01/20/2019   Patellofemoral pain syndrome of left knee 01/20/2019   Encounter for surveillance of injectable contraceptive 12/31/2018   Generalized anxiety disorder 11/18/2015   Depression, major, single episode, complete remission ([place]) 11/18/2015   Carpal tunnel syndrome of left wrist 10/09/2015    Patient Centered Plan: Patient is on the following Treatment Plan(s):  Depression   Referrals to Alternative Service(s): Referred to Alternative Service(s):   Place:   Date:   Time:    Referred to Alternative Service(s):   Place:   Date:   Time:    Referred to Alternative Service(s):   Place:   Date:   Time:    Referred to Alternative Service(s):   Place:   Date:   Time:     Yvette Rack, LCSW

## 2021-07-10 ENCOUNTER — Other Ambulatory Visit: Payer: Self-pay | Admitting: Nurse Practitioner

## 2021-07-10 ENCOUNTER — Encounter: Payer: Self-pay | Admitting: Nurse Practitioner

## 2021-07-10 ENCOUNTER — Telehealth (INDEPENDENT_AMBULATORY_CARE_PROVIDER_SITE_OTHER): Payer: BC Managed Care – PPO | Admitting: Nurse Practitioner

## 2021-07-10 ENCOUNTER — Telehealth: Payer: Self-pay | Admitting: *Deleted

## 2021-07-10 DIAGNOSIS — N939 Abnormal uterine and vaginal bleeding, unspecified: Secondary | ICD-10-CM

## 2021-07-10 DIAGNOSIS — Z975 Presence of (intrauterine) contraceptive device: Secondary | ICD-10-CM

## 2021-07-10 DIAGNOSIS — N921 Excessive and frequent menstruation with irregular cycle: Secondary | ICD-10-CM

## 2021-07-10 DIAGNOSIS — F411 Generalized anxiety disorder: Secondary | ICD-10-CM

## 2021-07-10 DIAGNOSIS — F325 Major depressive disorder, single episode, in full remission: Secondary | ICD-10-CM

## 2021-07-10 MED ORDER — MEGESTROL ACETATE 40 MG PO TABS
40.0000 mg | ORAL_TABLET | Freq: Every day | ORAL | 0 refills | Status: AC
Start: 2021-07-10 — End: 2021-07-20

## 2021-07-10 NOTE — Progress Notes (Signed)
Virtual Visit via Video Note  I connected withNAME@ on 07/10/21 at 10:00 AM EST by a video enabled telemedicine application and verified that I am speaking with the correct person using two identifiers.  Location: Patient:Home Provider: Office Participants: patient and provider  I discussed the limitations of evaluation and management by telemedicine and the availability of in person appointments. I also discussed with the patient that there may be a patient responsible charge related to this service. The patient expressed understanding and agreed to proceed.  CC: anxiety and depression f/up  History of Present Illness:  Depression, major, single episode, complete remission (HCC) Improved mood with fluoxetine and psychotherapy sessions. Denies any adverse side effects  Maintain med dose F/up in 3-19months    Observations/Objective: Physical Exam Pulmonary:     Effort: Pulmonary effort is normal.  Neurological:     Mental Status: Kristin Spencer is alert and oriented to person, place, and time.  Psychiatric:        Mood and Affect: Mood normal.        Behavior: Behavior normal.        Thought Content: Thought content normal.    Assessment and Plan: Kristin Spencer was seen today for follow-up.  Diagnoses and all orders for this visit:  Generalized anxiety disorder  Depression, major, single episode, complete remission (HCC)   Follow Up Instructions: See above   I discussed the assessment and treatment plan with the patient. The patient was provided an opportunity to ask questions and all were answered. The patient agreed with the plan and demonstrated an understanding of the instructions.   The patient was advised to call back or seek an in-person evaluation if the symptoms worsen or if the condition fails to improve as anticipated.  Alysia Penna, NP

## 2021-07-10 NOTE — Assessment & Plan Note (Signed)
Improved mood with fluoxetine and psychotherapy sessions. Denies any adverse side effects  Maintain med dose F/up in 3-63months

## 2021-07-10 NOTE — Patient Instructions (Addendum)
Vitamin D 2000IU daily B complex 1tab daily  Maintain current medication dose and therapy sessions

## 2021-07-10 NOTE — Assessment & Plan Note (Signed)
" >>  ASSESSMENT AND PLAN FOR DEPRESSION, RECURRENT WRITTEN ON 07/10/2021 10:33 AM BY Yovanna Cogan LUM, NP  Improved mood with fluoxetine  and psychotherapy sessions. Denies any adverse side effects  Maintain med dose F/up in 3-58months "

## 2021-07-10 NOTE — Telephone Encounter (Signed)
Patient called had Nexplanon inserted on 03/02/21, reports having BTB off and on since insertion. Patient reports on 06/19/21 bleeding started and has not stopped. Reports yesterday the bleeding increased so did the cramping. She is wearing pads, changing every 2 hours . Patient would like recommendations. Please advise

## 2021-07-10 NOTE — Telephone Encounter (Signed)
I will provider her with Megace to take daily x 10 days. If prolonged bleeding returns she will need to be seen to discuss management options.

## 2021-07-10 NOTE — Telephone Encounter (Signed)
Patient informed. 

## 2021-07-10 NOTE — Telephone Encounter (Signed)
Let her know [facility] [address].

## 2021-07-25 ENCOUNTER — Telehealth: Payer: Self-pay | Admitting: Nurse Practitioner

## 2021-07-25 NOTE — Telephone Encounter (Signed)
Returned pharmacist call calling to see why Rx was denied. Pharmacist verbally understood this Rx (Fluticasone) was prescribed by a different Provider.

## 2021-07-25 NOTE — Telephone Encounter (Signed)
[facility] [phone] needs clarification on this pts med fluoxetine.

## 2021-07-27 ENCOUNTER — Telehealth: Payer: Self-pay | Admitting: Nurse Practitioner

## 2021-07-27 NOTE — Telephone Encounter (Signed)
Needs [facility]

## 2021-08-01 ENCOUNTER — Other Ambulatory Visit: Payer: Self-pay

## 2021-08-01 ENCOUNTER — Ambulatory Visit (INDEPENDENT_AMBULATORY_CARE_PROVIDER_SITE_OTHER): Payer: BC Managed Care – PPO | Admitting: Clinical

## 2021-08-01 DIAGNOSIS — F331 Major depressive disorder, recurrent, moderate: Secondary | ICD-10-CM

## 2021-08-01 NOTE — Progress Notes (Signed)
° °  THERAPIST PROGRESS NOTE  Session Time: 9am  Participation Level: Active  Behavioral Response: CasualAlertAnxious  Type of Therapy: Individual Therapy  Treatment Goals addressed: develop healthy coping methods to challenge unwanted thoughts and emotion as evidenced by journaling 3/7 days per week at least 1 entry per day.  ProgressTowards Goals: Initial  Interventions: CBT Virtual Visit via Video Note  I connected with Lynne Leader on 08/01/21 at  9:00 AM EST by a video enabled telemedicine application and verified that I am speaking with the correct person using two identifiers.  Location: Patient: home Provider: office   I discussed the limitations of evaluation and management by telemedicine and the availability of in person appointments. The patient expressed understanding and agreed to proceed.   I discussed the assessment and treatment plan with the patient. The patient was provided an opportunity to ask questions and all were answered. The patient agreed with the plan and demonstrated an understanding of the instructions.   The patient was advised to call back or seek an in-person evaluation if the symptoms worsen or if the condition fails to improve as anticipated.  I provided 40 minutes of non-face-to-face time during this encounter.  Summary:  Pt says her mood has improved some but says she has been experiencing increased anxiety since last week. Pt says she is unsure what has triggered change in mood. Pt says she has good sleep pattern and has been more active despite change in mood. 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 Pt states decrease in social interaction which is uncharacteristic of her.  Suicidal/Homicidal: Pt denies SI/HI no plan, intent or attempt to harm self or others reported. Pt encouraged to call 911 or go to closest ED in an emergency.  Therapist Response: Utilized cognitive restructuring to challenge and change irrational  thoughts. Processed with pt cognitive distortion(personalization) and putting thoughts on trial to change thinking. Processed with pt ways to establish personal boundaries and assisted her in identifying limits in relationship.  Plan: Return again in 2 weeks.  Diagnosis: Moderate episode of recurrent major depressive disorder  Collaboration of Care: Other none requested  Patient/Guardian was advised Release of Information must be obtained prior to any record release in order to collaborate their care with an outside provider. Patient/Guardian was advised if they have not already done so to contact the registration department to sign all necessary forms in order for [place] to release information regarding their care.   Consent: Patient/Guardian gives verbal consent for treatment and assignment of benefits for services provided during this visit. Patient/Guardian expressed understanding and agreed to proceed.   Suzan Slick, LCSW 08/01/2021

## 2021-08-02 NOTE — Telephone Encounter (Signed)
Duplicate request. Addressed in another encounter.

## 2021-08-08 ENCOUNTER — Telehealth: Payer: BC Managed Care – PPO | Admitting: Nurse Practitioner

## 2021-08-09 ENCOUNTER — Telehealth: Payer: BC Managed Care – PPO | Admitting: Nurse Practitioner

## 2021-08-11 ENCOUNTER — Telehealth: Payer: Self-pay

## 2021-08-11 DIAGNOSIS — N921 Excessive and frequent menstruation with irregular cycle: Secondary | ICD-10-CM

## 2021-08-11 DIAGNOSIS — Z975 Presence of (intrauterine) contraceptive device: Secondary | ICD-10-CM

## 2021-08-11 MED ORDER — NORETHIN ACE-ETH ESTRAD-FE 1-20 MG-MCG PO TABS: 1.0000 | ORAL_TABLET | Freq: Every day | ORAL | 0 refills | Status: DC

## 2021-08-11 NOTE — Telephone Encounter (Signed)
TW patient calling re: cycle starting back up again since doing cycle of Megace in 07/2021. Had nexplanon inserted in 02/2021. Reports feeling very uncomfortable with excessive cramping and some nausea. Reports bleeding is moderate at this point so not too heavy. Its just the other sxs are really bothersome. Pt reports that she would like an appt for possible removal and quick discussion on other options, possibly go back to depo. However, advised pt there could be a temporary solution until appt can be made so I will forward to provider. Please advise. Thanks.  ?

## 2021-08-11 NOTE — Telephone Encounter (Signed)
Pt notified and voiced understanding. Desires for [place] to send Rx but for now would like to try and see how long this episode plays out for before she determines whether or not she would like to take nexplanon out and try something else. Reports she probably wont start the Rx right away but if sxs go on for 7days or more, will try it. In meantime, she would like me to send concerns to tiffany to get her input also.  ?

## 2021-08-14 NOTE — Telephone Encounter (Signed)
Left detailed VM on machine per DPR. Advised pt to call back with plans.  ?

## 2021-08-14 NOTE — Telephone Encounter (Signed)
I agree with trying OCPs for 3 months if she is agreeable. If she does not want to also take pills then we can schedule a visit to discuss alternatives to Nexplanon or if she knows she wants removed and to start Depo we can also get her scheduled for this as well. ?

## 2021-08-16 ENCOUNTER — Other Ambulatory Visit: Payer: Self-pay

## 2021-08-16 ENCOUNTER — Ambulatory Visit (INDEPENDENT_AMBULATORY_CARE_PROVIDER_SITE_OTHER): Payer: BC Managed Care – PPO | Admitting: Clinical

## 2021-08-16 DIAGNOSIS — F331 Major depressive disorder, recurrent, moderate: Secondary | ICD-10-CM

## 2021-08-16 NOTE — Progress Notes (Signed)
? ?  THERAPIST PROGRESS NOTE ? ?Session Time: 10am ? ?Participation Level: Active ? ?Behavioral Response: Casual and NeatAlertEuthymic ? ?Type of Therapy: Individual Therapy ? ?Treatment Goals addressed:  develop healthy coping methods to challenge unwanted thoughts and emotion as evidenced by journaling 3/7 days per week at least 1 entry per day. ? ?ProgressTowards Goals: Progressing ? ?Interventions: CBT ? ?Summary: Pt states improvement in her mood. Pt states she has applied intervention discussed during previous session and finds it helpful with managing her mood. Pt says she has been slow to jump to conclusions and more mindful of tendency to personalizing things that are out of her control. ? ?Suicidal/Homicidal:  Pt denies SI/HI no plan, intent or attempt to harm self or others reported. Pt encouraged to call 911 or go to closest ED in an emergency. ? ?Therapist Response: Session consisted of reviewing interventions from previous session and introducing 3 mind sets(wise, reasonable, emotional). Processed with pt consequences of each and provided examples of each mindset. ? ?Plan: Return again in 2 weeks. ? ?Diagnosis: Moderate episode of recurrent major depressive disorder ? ?Collaboration of Care: Other none requested ? ?Patient/Guardian was advised Release of Information must be obtained prior to any record release in order to collaborate their care with an outside provider. Patient/Guardian was advised if they have not already done so to contact the registration department to sign all necessary forms in order for [place] to release information regarding their care.  ? ?Consent: Patient/Guardian gives verbal consent for treatment and assignment of benefits for services provided during this visit. Patient/Guardian expressed understanding and agreed to proceed.  ? ?Kristin Charo Lynelle Smoke, LCSW ?08/16/2021 ? ?

## 2021-08-22 NOTE — Telephone Encounter (Signed)
MyChart msg sent:  ? ?"Hello Jatoya, this is Chief Financial Officer from [employer] office at [employer]. Just wanting to touch base with you and see how you are doing and how you plan to move forward for now so that I can close out our previous encounter if needed. Let us know. Thanks." ?

## 2021-08-22 NOTE — Telephone Encounter (Signed)
FYI. If you desire to see her plan for now.  ?Refer to [employer] on 08/22/21.  ?

## 2021-09-06 ENCOUNTER — Ambulatory Visit (HOSPITAL_COMMUNITY): Payer: BC Managed Care – PPO | Admitting: Clinical

## 2021-09-27 ENCOUNTER — Ambulatory Visit (HOSPITAL_COMMUNITY): Payer: BC Managed Care – PPO | Admitting: Clinical

## 2021-09-27 ENCOUNTER — Encounter (HOSPITAL_COMMUNITY): Payer: Self-pay

## 2021-11-01 ENCOUNTER — Other Ambulatory Visit: Payer: Self-pay | Admitting: Nurse Practitioner

## 2021-11-01 DIAGNOSIS — F331 Major depressive disorder, recurrent, moderate: Secondary | ICD-10-CM

## 2021-11-01 NOTE — Telephone Encounter (Signed)
Chart Supports Rx Last OV: 07/2021 (Video) Next OV: 11/2021

## 2021-11-16 ENCOUNTER — Encounter: Payer: BC Managed Care – PPO | Admitting: Nurse Practitioner

## 2021-11-17 ENCOUNTER — Ambulatory Visit (INDEPENDENT_AMBULATORY_CARE_PROVIDER_SITE_OTHER): Payer: BC Managed Care – PPO | Admitting: Nurse Practitioner

## 2021-11-17 ENCOUNTER — Encounter: Payer: Self-pay | Admitting: Nurse Practitioner

## 2021-11-17 VITALS — BP 122/78 | HR 97 | Temp 98.0°F | Ht 63.0 in | Wt 195.2 lb

## 2021-11-17 DIAGNOSIS — E669 Obesity, unspecified: Secondary | ICD-10-CM | POA: Insufficient documentation

## 2021-11-17 DIAGNOSIS — E559 Vitamin D deficiency, unspecified: Secondary | ICD-10-CM | POA: Diagnosis not present

## 2021-11-17 DIAGNOSIS — E6609 Other obesity due to excess calories: Secondary | ICD-10-CM | POA: Diagnosis not present

## 2021-11-17 DIAGNOSIS — N921 Excessive and frequent menstruation with irregular cycle: Secondary | ICD-10-CM | POA: Insufficient documentation

## 2021-11-17 DIAGNOSIS — Z0001 Encounter for general adult medical examination with abnormal findings: Secondary | ICD-10-CM | POA: Diagnosis not present

## 2021-11-17 DIAGNOSIS — R739 Hyperglycemia, unspecified: Secondary | ICD-10-CM

## 2021-11-17 DIAGNOSIS — F3342 Major depressive disorder, recurrent, in full remission: Secondary | ICD-10-CM | POA: Insufficient documentation

## 2021-11-17 DIAGNOSIS — E663 Overweight: Secondary | ICD-10-CM | POA: Insufficient documentation

## 2021-11-17 DIAGNOSIS — Z13220 Encounter for screening for lipoid disorders: Secondary | ICD-10-CM

## 2021-11-17 DIAGNOSIS — Z6834 Body mass index (BMI) 34.0-34.9, adult: Secondary | ICD-10-CM

## 2021-11-17 DIAGNOSIS — Z136 Encounter for screening for cardiovascular disorders: Secondary | ICD-10-CM

## 2021-11-17 NOTE — Patient Instructions (Addendum)
[place]  Schedule lab appt. Need to be fasting 8hrs prior to blood draw. Ok to drink water.  Preventive Care 57-25 Years Old, Female Preventive care refers to lifestyle choices and visits with your health care provider that can promote health and wellness. Preventive care visits are also called wellness exams. What can I expect for my preventive care visit? Counseling During your preventive care visit, your health care provider may ask about your: Medical history, including: Past medical problems. Family medical history. Pregnancy history. Current health, including: Menstrual cycle. Method of birth control. Emotional well-being. Home life and relationship well-being. Sexual activity and sexual health. Lifestyle, including: Alcohol, nicotine or tobacco, and drug use. Access to firearms. Diet, exercise, and sleep habits. Work and work Statistician. Sunscreen use. Safety issues such as seatbelt and bike helmet use. Physical exam Your health care provider may check your: Height and weight. These may be used to calculate your BMI (body mass index). BMI is a measurement that tells if you are at a healthy weight. Waist circumference. This measures the distance around your waistline. This measurement also tells if you are at a healthy weight and may help predict your risk of certain diseases, such as type 2 diabetes and high blood pressure. Heart rate and blood pressure. Body temperature. Skin for abnormal spots. What immunizations do I need?  Vaccines are usually given at various ages, according to a schedule. Your health care provider will recommend vaccines for you based on your age, medical history, and lifestyle or other factors, such as travel or where you work. What tests do I need? Screening Your health care provider may recommend screening tests for certain conditions. This may include: Pelvic exam and Pap test. Lipid and cholesterol levels. Diabetes screening. This is  done by checking your blood sugar (glucose) after you have not eaten for a while (fasting). Hepatitis B test. Hepatitis C test. HIV (human immunodeficiency virus) test. STI (sexually transmitted infection) testing, if you are at risk. BRCA-related cancer screening. This may be done if you have a family history of breast, ovarian, tubal, or peritoneal cancers. Talk with your health care provider about your test results, treatment options, and if necessary, the need for more tests. Follow these instructions at home: Eating and drinking  Eat a healthy diet that includes fresh fruits and vegetables, whole grains, lean protein, and low-fat dairy products. Take vitamin and mineral supplements as recommended by your health care provider. Do not drink alcohol if: Your health care provider tells you not to drink. You are pregnant, may be pregnant, or are planning to become pregnant. If you drink alcohol: Limit how much you have to 0-1 drink a day. Know how much alcohol is in your drink. In the [place] one drink equals one 12 oz bottle of beer (355 mL), one 5 oz glass of wine (148 mL), or one 1 oz glass of hard liquor (44 mL). Lifestyle Brush your teeth every morning and night with fluoride toothpaste. Floss one time each day. Exercise for at least 30 minutes 5 or more days each week. Do not use any products that contain nicotine or tobacco. These products include cigarettes, chewing tobacco, and vaping devices, such as e-cigarettes. If you need help quitting, ask your health care provider. Do not use drugs. If you are sexually active, practice safe sex. Use a condom or other form of protection to prevent STIs. If you do not wish to become pregnant, use a form of birth control. If you plan  to become pregnant, see your health care provider for a prepregnancy visit. Find healthy ways to manage stress, such as: Meditation, yoga, or listening to music. Journaling. Talking to a trusted person. Spending  time with friends and family. Minimize exposure to UV radiation to reduce your risk of skin cancer. Safety Always wear your seat belt while driving or riding in a vehicle. Do not drive: If you have been drinking alcohol. Do not ride with someone who has been drinking. If you have been using any mind-altering substances or drugs. While texting. When you are tired or distracted. Wear a helmet and other protective equipment during sports activities. If you have firearms in your house, make sure you follow all gun safety procedures. Seek help if you have been physically or sexually abused. What's next? Go to your health care provider once a year for an annual wellness visit. Ask your health care provider how often you should have your eyes and teeth checked. Stay up to date on all vaccines. This information is not intended to replace advice given to you by your health care provider. Make sure you discuss any questions you have with your health care provider. Document Revised: 11/16/2020 Document Reviewed: 11/16/2020 Elsevier Patient Education  [place].

## 2021-11-17 NOTE — Assessment & Plan Note (Signed)
>>  ASSESSMENT AND PLAN FOR OBESITY (BMI 30.0-34.9) WRITTEN ON 11/17/2021 12:15 PM BY Carmino Ocain LUM, NP  Advised to maintain heart healthy diet. Continue daily exercise. Wt Readings from Last 3 Encounters:  11/17/21 195 lb 3.2 oz (88.5 kg)  05/23/21 201 lb 3.2 oz (91.3 kg)  01/11/21 204 lb (92.5 kg)

## 2021-11-17 NOTE — Assessment & Plan Note (Signed)
Advised to maintain heart healthy diet. Continue daily exercise. Wt Readings from Last 3 Encounters:  11/17/21 195 lb 3.2 oz (88.5 kg)  05/23/21 201 lb 3.2 oz (91.3 kg)  01/11/21 204 lb (92.5 kg)

## 2021-11-17 NOTE — Assessment & Plan Note (Signed)
" >>  ASSESSMENT AND PLAN FOR DEPRESSION, RECURRENT WRITTEN ON 11/17/2021 12:07 PM BY Cedrick Partain LUM, NP  Stable Sessions with psychologist  Maintain fluoxetine  dose "

## 2021-11-17 NOTE — Assessment & Plan Note (Signed)
Stable Sessions with psychologist  Maintain fluoxetine dose

## 2021-11-17 NOTE — Progress Notes (Signed)
Complete physical exam  Patient: Kristin Spencer   DOB: 05-Sep-1995   25 y.o. Female  MRN: [id number] Visit Date: 11/17/2021  Subjective:    Chief Complaint  Patient presents with   Annual Exam    CPE Pt not fasting Interested in a breast reduction    Kristin Spencer is a 26 y.o. female who presents today for a complete physical exam. She reports consuming a general diet.  Walking 3mns daily  She generally feels well. She reports sleeping well. She does not have additional problems to discuss today.  Vision:Yes Dental:Yes STD Screen:No  Most recent fall risk assessment:    07/10/2021    9:53 AM  [place] the past year? 0  Number falls in past yr: 0  Injury with Fall? 0  Risk for fall due to : No Fall Risks  Follow up Falls evaluation completed   Most recent depression screenings:    11/17/2021   11:37 AM 07/10/2021    9:53 AM  PHQ 2/9 Scores  PHQ - 2 Score 1 0  PHQ- 9 Score 4    HPI  Depression, recurrent (H[facility]) Stable Sessions with psychologist  Maintain fluoxetine dose  Menorrhagia with irregular cycle implanon in arm Vaginal bleeding every other month, last 2weeks, no clots, no dysmenorrhea  Advised to f/up with GYN Check iron and cbc  Obesity (BMI 30.0-34.9) Advised to maintain heart healthy diet. Continue daily exercise. Wt Readings from Last 3 Encounters:  11/17/21 195 lb 3.2 oz (88.5 kg)  05/23/21 201 lb 3.2 oz (91.3 kg)  01/11/21 204 lb (92.5 kg)   Past Medical History:  Diagnosis Date   Anxiety    Depression    Past Surgical History:  Procedure Laterality Date   WISDOM TOOTH EXTRACTION     Social History   Socioeconomic History   Marital status: Single    Spouse name: Not on file   Number of children: 0   Years of education: Not on file   Highest education level: Not on file  Occupational History   Not on file  Tobacco Use   Smoking status: Never   Smokeless tobacco: Never  Vaping Use   Vaping Use: Never used   Substance and Sexual Activity   Alcohol use: Yes    Comment: occasionally   Drug use: Never   Sexual activity: Yes    Birth control/protection: Implant  Other Topics Concern   Not on file  Social History Narrative   ** Merged History Encounter **       Social Determinants of Health   Financial Resource Strain: Not on file  Food Insecurity: Not on file  Transportation Needs: Not on file  Physical Activity: Not on file  Stress: Not on file  Social Connections: Not on file  Intimate Partner Violence: Not on file   Family Status  Relation Name Status   Mother  Alive   Father  Alive   Sister  Alive   Mat Aunt  Deceased   PPsychiatrist (Not Specified)   PAnnamarie Major (Not Specified)   MGF  (Not Specified)   PGF  (Not Specified)   Neg Hx  (Not Specified)   Family History  Problem Relation Age of Onset   Diabetes Father    Allergic rhinitis Father    Food Allergy Father    Allergic rhinitis Sister    Cancer Maternal Aunt        breast  Diabetes Paternal Uncle    Diabetes Paternal Uncle    Diabetes Maternal Grandfather    Diabetes Paternal Grandfather    Heart disease Paternal Grandfather    Heart attack Paternal Grandfather    Angioedema Neg Hx    Asthma Neg Hx    Eczema Neg Hx    Immunodeficiency Neg Hx    Urticaria Neg Hx    Allergies  Allergen Reactions   Shellfish Allergy Shortness Of Breath and Nausea And Vomiting    Patient Care Team: Janie Strothman, Charlene Brooke, NP as PCP - General (Internal Medicine)   Medications: Outpatient Medications Prior to Visit  Medication Sig   azelastine (ASTELIN) 0.1 % nasal spray 2 sprays per nostril twice a day if needed for sinus headache   Cetirizine HCl (ZYRTEC PO) Take by mouth.   famotidine (PEPCID) 20 MG tablet Take 1 tablet twice a day if needed for heartburn   FLUoxetine (PROZAC) 40 MG capsule TAKE 1 CAPSULE DAILY   fluticasone (FLONASE) 50 MCG/ACT nasal spray 2 sprays per nostril once a day if needed for stuffy nose    [DISCONTINUED] norethindrone-ethinyl estradiol-FE (JUNEL FE 1/20) 1-20 MG-MCG tablet Take 1 tablet by mouth daily.   Facility-Administered Medications Prior to Visit  Medication Dose Route Frequency Provider   etonogestrel (NEXPLANON) implant 68 mg  68 mg Subdermal Once Marny Lowenstein A, NP   Review of Systems      Objective:  BP 122/78 (BP Location: Right Arm, Patient Position: Sitting, Cuff Size: Normal)   Pulse 97   Temp 98 F (36.7 C) (Temporal)   Ht _0  (1.6 m)   Wt 195 lb 3.2 oz (88.5 kg)   SpO2 97%   BMI 34.58 kg/m       Physical Exam Vitals reviewed.  Constitutional:      General: She is not in acute distress. HENT:     Right Ear: Tympanic membrane, ear canal and external ear normal.     Left Ear: Tympanic membrane, ear canal and external ear normal.     Nose: Nose normal.     Mouth/Throat:     Pharynx: No oropharyngeal exudate.  Eyes:     General: No scleral icterus.    Extraocular Movements: Extraocular movements intact.     Conjunctiva/sclera: Conjunctivae normal.  Cardiovascular:     Rate and Rhythm: Normal rate and regular rhythm.     Pulses: Normal pulses.     Heart sounds: Normal heart sounds.  Pulmonary:     Effort: Pulmonary effort is normal. No respiratory distress.     Breath sounds: Normal breath sounds.  Chest:     Chest wall: No mass or tenderness.  Breasts:    Breasts are symmetrical.     Right: Normal.     Left: Normal.  Abdominal:     General: Bowel sounds are normal. There is no distension.     Palpations: Abdomen is soft.  Musculoskeletal:        General: Normal range of motion.     Cervical back: Normal range of motion and neck supple.  Lymphadenopathy:     Cervical: No cervical adenopathy.     Upper Body:     Right upper body: No supraclavicular, axillary or pectoral adenopathy.     Left upper body: No supraclavicular, axillary or pectoral adenopathy.  Skin:    General: Skin is warm and dry.  Neurological:     Mental  Status: She is alert and oriented to person, place, and time.  Psychiatric:        Mood and Affect: Mood normal.        Behavior: Behavior normal.        Thought Content: Thought content normal.     No results found for any visits on 11/17/21.    Assessment & Plan:    Routine Health Maintenance and Physical Exam  Immunization History  Administered Date(s) Administered   DTaP 07/27/1997, 10/06/1997, 02/22/1998, 05/03/2000, 06/02/2002   HPV 9-valent 10/07/2015, 11/14/2020, 05/16/2021   Hepatitis A 10/07/2015   Hepatitis A, Ped/Adol-2 Dose 10/07/2015   Hepatitis B 1996/06/11, 05/31/1997, 02/22/1998   Hepatitis B, ped/adol 1996-10[birth year]09, 05/31/1997, 02/22/1998   HiB (PRP-T) 07/27/1997, 10/06/1997, 07/19/1998   IPV 07/27/1997, 10/06/1997, 02/21/1998, 06/02/2002   Influenza,inj,Quad PF,6+ Mos 03/22/2015, 05/23/2021   MMR 08/17/1998, 06/02/2002   Meningococcal Conjugate 09/22/2013, 10/07/2015   PFIZER(Purple Top)SARS-COV-2 Vaccination 08/15/2019, 09/08/2019   Tdap 01/24/2009, 10/07/2015   Varicella 08/17/1998, 09/22/2013   Health Maintenance  Topic Date Due   COVID-19 Vaccine (3 - Pfizer series) 12/03/2021 (Originally 11/03/2019)   INFLUENZA VACCINE  01/02/2022   PAP-Cervical Cytology Screening  11/15/2023   PAP SMEAR-Modifier  11/15/2023   TETANUS/TDAP  10/06/2025   HPV VACCINES  Completed   Hepatitis C Screening  Completed   HIV Screening  Completed   Discussed health benefits of physical activity, and encouraged her to engage in regular exercise appropriate for her age and condition.  Problem List Items Addressed This Visit       Other   Depression, recurrent ([place])    Stable Sessions with psychologist  Maintain fluoxetine dose      Hyperglycemia   Relevant Orders   Hemoglobin A1c   Menorrhagia with irregular cycle    implanon in arm Vaginal bleeding every other month, last 2weeks, no clots, no dysmenorrhea  Advised to f/up with GYN Check iron and cbc       Relevant Orders   CBC   IBC + Ferritin   Obesity (BMI 30.0-34.9)    Advised to maintain heart healthy diet. Continue daily exercise. Wt Readings from Last 3 Encounters:  11/17/21 195 lb 3.2 oz (88.5 kg)  05/23/21 201 lb 3.2 oz (91.3 kg)  01/11/21 204 lb (92.5 kg)        Other Visit Diagnoses     Encounter for preventative adult health care exam with abnormal findings    -  Primary   Relevant Orders   Comprehensive metabolic panel   Encounter for lipid screening for cardiovascular disease       Vitamin D insufficiency       Relevant Orders   VITAMIN D 25 Hydroxy (Vit-D Deficiency, Fractures)      Return in about 6 months (around 05/19/2022) for anxiety and depression.     Wilfred Lacy, NP

## 2021-11-17 NOTE — Assessment & Plan Note (Signed)
implanon in arm Vaginal bleeding every other month, last 2weeks, no clots, no dysmenorrhea  Advised to f/up with GYN Check iron and cbc

## 2021-11-20 ENCOUNTER — Other Ambulatory Visit: Payer: Self-pay

## 2021-11-20 ENCOUNTER — Other Ambulatory Visit (INDEPENDENT_AMBULATORY_CARE_PROVIDER_SITE_OTHER): Payer: BC Managed Care – PPO

## 2021-11-20 DIAGNOSIS — Z0001 Encounter for general adult medical examination with abnormal findings: Secondary | ICD-10-CM

## 2021-11-20 DIAGNOSIS — E6609 Other obesity due to excess calories: Secondary | ICD-10-CM

## 2021-11-20 DIAGNOSIS — Z13220 Encounter for screening for lipoid disorders: Secondary | ICD-10-CM | POA: Diagnosis not present

## 2021-11-20 DIAGNOSIS — R739 Hyperglycemia, unspecified: Secondary | ICD-10-CM | POA: Diagnosis not present

## 2021-11-20 DIAGNOSIS — N921 Excessive and frequent menstruation with irregular cycle: Secondary | ICD-10-CM

## 2021-11-20 DIAGNOSIS — Z136 Encounter for screening for cardiovascular disorders: Secondary | ICD-10-CM

## 2021-11-20 DIAGNOSIS — E559 Vitamin D deficiency, unspecified: Secondary | ICD-10-CM

## 2021-11-20 LAB — COMPREHENSIVE METABOLIC PANEL
ALT: 36 U/L — ABNORMAL HIGH (ref 0–35)
AST: 26 U/L (ref 0–37)
Albumin: 4.4 g/dL (ref 3.5–5.2)
Alkaline Phosphatase: 74 U/L (ref 39–117)
BUN: 8 mg/dL (ref 6–23)
CO2: 24 mEq/L (ref 19–32)
Calcium: 9.5 mg/dL (ref 8.4–10.5)
Chloride: 105 mEq/L (ref 96–112)
Creatinine, Ser: 0.7 mg/dL (ref 0.40–1.20)
GFR: 120.93 mL/min (ref >60.00)
Glucose, Bld: 91 mg/dL (ref 70–99)
Potassium: 4.2 mEq/L (ref 3.5–5.1)
Sodium: 140 mEq/L (ref 135–145)
Total Bilirubin: 0.4 mg/dL (ref 0.2–1.2)
Total Protein: 7 g/dL (ref 6.0–8.3)

## 2021-11-20 LAB — VITAMIN D 25 HYDROXY (VIT D DEFICIENCY, FRACTURES): VITD: 43.95 ng/mL (ref 30.00–100.00)

## 2021-11-20 LAB — LIPID PANEL
Cholesterol: 125 mg/dL (ref 0–200)
HDL: 37.3 mg/dL — ABNORMAL LOW (ref >39.00)
LDL Cholesterol: 69 mg/dL (ref 0–99)
NonHDL: 87.22
Total CHOL/HDL Ratio: 3
Triglycerides: 93 mg/dL (ref 0.0–149.0)
VLDL: 18.6 mg/dL (ref 0.0–40.0)

## 2021-11-20 LAB — CBC
HCT: 39.9 % (ref 36.0–46.0)
Hemoglobin: 13.2 g/dL (ref 12.0–15.0)
MCHC: 33.1 g/dL (ref 30.0–36.0)
MCV: 81.1 fl (ref 78.0–100.0)
Platelets: 257 10*3/uL (ref 150.0–400.0)
RBC: 4.92 Mil/uL (ref 3.87–5.11)
RDW: 13.4 % (ref 11.5–15.5)
WBC: 4.6 10*3/uL (ref 4.0–10.5)

## 2021-11-20 LAB — HEMOGLOBIN A1C: Hgb A1c MFr Bld: 5.5 % (ref 4.6–6.5)

## 2021-11-27 ENCOUNTER — Telehealth: Payer: Self-pay

## 2021-11-27 NOTE — Telephone Encounter (Signed)
Spoke with patient and informed her. °

## 2021-11-29 ENCOUNTER — Ambulatory Visit: Payer: BC Managed Care – PPO | Admitting: Nurse Practitioner

## 2021-11-29 ENCOUNTER — Encounter: Payer: Self-pay | Admitting: Nurse Practitioner

## 2021-11-29 VITALS — BP 132/82

## 2021-11-29 DIAGNOSIS — Z3009 Encounter for other general counseling and advice on contraception: Secondary | ICD-10-CM

## 2021-11-29 DIAGNOSIS — Z975 Presence of (intrauterine) contraceptive device: Secondary | ICD-10-CM

## 2021-11-29 DIAGNOSIS — N921 Excessive and frequent menstruation with irregular cycle: Secondary | ICD-10-CM | POA: Diagnosis not present

## 2021-11-29 NOTE — Progress Notes (Signed)
   Acute Office Visit  Subjective:    Patient ID: Jalila Goodnough, female    DOB: 05-01-1996, 26 y.o.   MRN: [id number]   HPI 26 y.o. presents today for irregular bleeding with Nexplanon. Nexplanon inserted 02/2021. Until January she was having infrequent spotting that lasted a few days. Since January menses have been heavy and prolonged. She has used Megace to stop bleeding previously. COCs also discussed in the past but she does not want to take in conjunction with Nexplanon. Bleeding 08/11/2021-08/30/2021 with heavy bleeding first week or so but then lightened up. Bleeding again 10/25/2021 - 11/20/2021 with similar bleeding pattern. Bleeding then started 11/24/21 and lasted a few days. Was on Depo in the past and is interested in restarting. No plans for pregnancy. Elevated BP with COCs in the past.    Review of Systems  Constitutional: Negative.   Genitourinary:  Positive for menstrual problem.       Objective:    Physical Exam Constitutional:      Appearance: Normal appearance.  Genitourinary:    General: Normal vulva.     Vagina: Normal.     Cervix: Normal.     Uterus: Normal.      BP 132/82   LMP 11/24/2021  Wt Readings from Last 3 Encounters:  11/17/21 195 lb 3.2 oz (88.5 kg)  05/23/21 201 lb 3.2 oz (91.3 kg)  01/11/21 204 lb (92.5 kg)        Patient informed chaperone available to be present for breast and/or pelvic exam. Patient has requested no chaperone to be present. Patient has been advised what will be completed during breast and pelvic exam.   Assessment & Plan:   Problem List Items Addressed This Visit   None Visit Diagnoses     Breakthrough bleeding on Nexplanon    -  Primary   Encounter for other general counseling and advice on contraception          Plan: Discussed options for Megace although this is temporary fix, low dose COCs x 3 months or switching to alternative method. Progestin-only recommended. She wants to restart Depo. We did discuss  length of time it can take for ovulation to return after stopping Depo but she is fine with it since she has no plans for pregnancy. She will schedule appointment for Nexplanon removal. All questions answered.      Olivia Mackie DNP, 12:14 PM 11/29/2021

## 2021-12-12 ENCOUNTER — Other Ambulatory Visit: Payer: Self-pay

## 2021-12-12 DIAGNOSIS — Z3046 Encounter for surveillance of implantable subdermal contraceptive: Secondary | ICD-10-CM

## 2021-12-13 ENCOUNTER — Ambulatory Visit: Payer: BC Managed Care – PPO | Admitting: Nurse Practitioner

## 2021-12-13 ENCOUNTER — Encounter: Payer: Self-pay | Admitting: Nurse Practitioner

## 2021-12-13 VITALS — BP 122/74 | HR 62 | Wt 196.0 lb

## 2021-12-13 DIAGNOSIS — Z30013 Encounter for initial prescription of injectable contraceptive: Secondary | ICD-10-CM | POA: Diagnosis not present

## 2021-12-13 DIAGNOSIS — Z3046 Encounter for surveillance of implantable subdermal contraceptive: Secondary | ICD-10-CM

## 2021-12-13 MED ORDER — MEDROXYPROGESTERONE ACETATE 150 MG/ML IM SUSP
150.0000 mg | Freq: Once | INTRAMUSCULAR | Status: AC
  Administered 2021-12-13: 150 mg via INTRAMUSCULAR

## 2021-12-13 NOTE — [place] Notes (Signed)
26 y.o. G0P0000 presents for Nexplanon removal.  She has been experiencing irregular bleeding.  She has decided to use Depo Provera for future contraception.  Procedure, risks and benefits have all been explained.  She has the following questions today:  None.     LMP:  11/24/2021   After all questions were answered, consent was obtained.    Past Medical History:  Diagnosis Date   Anxiety    Depression     Past Surgical History:  Procedure Laterality Date   WISDOM TOOTH EXTRACTION      Current Outpatient Medications on File Prior to Visit  Medication Sig Dispense Refill   azelastine (ASTELIN) 0.1 % nasal spray 2 sprays per nostril twice a day if needed for sinus headache 30 mL 5   Cetirizine HCl (ZYRTEC PO) Take by mouth.     famotidine (PEPCID) 20 MG tablet Take 1 tablet twice a day if needed for heartburn 64 tablet 5   FLUoxetine (PROZAC) 40 MG capsule TAKE 1 CAPSULE DAILY 90 capsule 3   fluticasone (FLONASE) 50 MCG/ACT nasal spray 2 sprays per nostril once a day if needed for stuffy nose 16 g 5   No current facility-administered medications on file prior to visit.   Allergies  Allergen Reactions   Shellfish Allergy Shortness Of Breath and Nausea And Vomiting    Vitals:   12/13/21 1000  BP: 122/74  Pulse: 62  SpO2: 99%   Physical Exam  Procedure: Patient placed supine on exam table with her left arm flexed at the elbow. The prior insertion site was located and the Nexplanon rod was palpated.  Area cleansed with Betadine x 3 and draped in normal sterile fashion.  Insertion site and surrounding tissue anesthetized with 1% Lidocaine without epinephrine, 2cc total used.  Small incision made with #11 blade.  Nexplanon removed without difficulty.  Steri-strips were applied and pressure dressing placed over the site.  Entire procedure performed with sterile technique.  Pt tolerated procedure well.  Assessment: Nexplanon removal  Plan:  Post procedure instructions reviewed with  pt.  Questions answered.  Pt knows to call with any concerns or questions.  New contraception:  Depo Provera

## 2022-01-04 ENCOUNTER — Encounter: Payer: Self-pay | Admitting: Nurse Practitioner

## 2022-01-09 ENCOUNTER — Telehealth: Payer: BC Managed Care – PPO | Admitting: Physician Assistant

## 2022-01-09 DIAGNOSIS — R12 Heartburn: Secondary | ICD-10-CM

## 2022-01-10 MED ORDER — FAMOTIDINE 20 MG PO TABS: ORAL_TABLET | ORAL | 0 refills | Status: DC

## 2022-01-10 NOTE — [place] Notes (Signed)
E-Visit for Heartburn  We are sorry that you are not feeling well.  Here is how we plan to help!  Based on what you shared with me it looks like you most likely have Gastroesophageal Reflux Disease (GERD)  Gastroesophageal reflux disease (GERD) happens when acid from your stomach flows up into the esophagus.  When acid comes in contact with the esophagus, the acid causes sorenss (inflammation) in the esophagus.  Over time, GERD may create small holes (ulcers) in the lining of the esophagus.  I recommend using over the counter Pepcid 20mg  one by mouth twice a day for two weeks. I have sent in the prescription for this but it is also available over the counter.   Your symptoms should improve in the next day or two.  You can use antacids as needed until symptoms resolve.  Call if your heartburn worsens, you have trouble swallowing, weight loss, spitting up blood or recurrent vomiting.  Home Care: May include lifestyle changes such as weight loss, quitting smoking and alcohol consumption Avoid foods and drinks that make your symptoms worse, such as: Caffeine or alcoholic drinks Chocolate Peppermint or mint flavorings Garlic and onions Spicy foods Citrus fruits, such as oranges, lemons, or limes Tomato-based foods such as sauce, chili, salsa and pizza Fried and fatty foods Avoid lying down for 3 hours prior to your bedtime or prior to taking a nap Eat small, frequent meals instead of a large meals Wear loose-fitting clothing.  Do not wear anything tight around your waist that causes pressure on your stomach. Raise the head of your bed 6 to 8 inches with wood blocks to help you sleep.  Extra pillows will not help.  Seek Help Right Away If: You have pain in your arms, neck, jaw, teeth or back Your pain increases or changes in intensity or duration You develop nausea, vomiting or sweating (diaphoresis) You develop shortness of breath or you faint Your vomit is green, yellow, black or  looks like coffee grounds or blood Your stool is red, bloody or black  These symptoms could be signs of other problems, such as heart disease, gastric bleeding or esophageal bleeding.  Make sure you : Understand these instructions. Will watch your condition. Will get help right away if you are not doing well or get worse.  Your e-visit answers were reviewed by a board certified advanced clinical practitioner to complete your personal care plan.  Depending on the condition, your plan could have included both over the counter or prescription medications.  If there is a problem please reply  once you have received a response from your provider.  Your safety is important to [place].  If you have drug allergies check your prescription carefully.    You can use MyChart to ask questions about today's visit, request a non-urgent call back, or ask for a work or school excuse for 24 hours related to this e-Visit. If it has been greater than 24 hours you will need to follow up with your provider, or enter a new e-Visit to address those concerns.  You will get an e-mail in the next two days asking about your experience.  I hope that your e-visit has been valuable and will speed your recovery. Thank you for using e-visits.  I provided 5 minutes of non face-to-face time during this encounter for chart review and documentation.

## 2022-03-01 ENCOUNTER — Ambulatory Visit (INDEPENDENT_AMBULATORY_CARE_PROVIDER_SITE_OTHER): Payer: BC Managed Care – PPO

## 2022-03-01 DIAGNOSIS — Z309 Encounter for contraceptive management, unspecified: Secondary | ICD-10-CM

## 2022-03-01 DIAGNOSIS — Z3042 Encounter for surveillance of injectable contraceptive: Secondary | ICD-10-CM | POA: Diagnosis not present

## 2022-03-01 MED ORDER — MEDROXYPROGESTERONE ACETATE 150 MG/ML IM SUSP
150.0000 mg | Freq: Once | INTRAMUSCULAR | Status: AC
  Administered 2022-03-01: 150 mg via INTRAMUSCULAR

## 2022-03-01 NOTE — [place] Notes (Signed)
..  After obtaining consent, and per orders of [facility], injection of MedroxyProgesterone Acetate given by Augustina Mood. Patient tolarated injection well, and to report any adverse reaction to me immediately.

## 2022-03-13 ENCOUNTER — Ambulatory Visit (INDEPENDENT_AMBULATORY_CARE_PROVIDER_SITE_OTHER): Payer: BC Managed Care – PPO | Admitting: Nurse Practitioner

## 2022-03-13 ENCOUNTER — Encounter: Payer: Self-pay | Admitting: Nurse Practitioner

## 2022-03-13 ENCOUNTER — Other Ambulatory Visit (HOSPITAL_COMMUNITY)
Admission: RE | Admit: 2022-03-13 | Discharge: 2022-03-13 | Disposition: A | Discharge status: Home / Self Care | Payer: BC Managed Care – PPO | Source: Ambulatory Visit | Attending: Nurse Practitioner | Admitting: Nurse Practitioner

## 2022-03-13 VITALS — BP 110/78 | HR 73 | Temp 98.1°F | Ht 63.0 in | Wt 197.2 lb

## 2022-03-13 DIAGNOSIS — N760 Acute vaginitis: Secondary | ICD-10-CM | POA: Diagnosis not present

## 2022-03-13 DIAGNOSIS — B3731 Acute candidiasis of vulva and vagina: Secondary | ICD-10-CM | POA: Insufficient documentation

## 2022-03-13 DIAGNOSIS — N898 Other specified noninflammatory disorders of vagina: Secondary | ICD-10-CM | POA: Insufficient documentation

## 2022-03-13 DIAGNOSIS — Z23 Encounter for immunization: Secondary | ICD-10-CM

## 2022-03-13 NOTE — [place] Notes (Unsigned)
Established Patient Visit  Patient: Kristin Spencer   DOB: [birth year]   26 y.o. Female  MRN: [id number] Visit Date: 03/13/2022  Subjective:    Chief Complaint  Patient presents with   Vaginitis    Pt states she is having yeast issues 5th case within 5 months.   Vaginal Itching The patient's primary symptoms include genital itching and vaginal discharge. The patient's pertinent negatives include no genital lesions, genital rash, missed menses, pelvic pain or vaginal bleeding. This is a recurrent problem. The current episode started more than 1 month ago. The problem occurs intermittently. The problem has been waxing and waning. The patient is experiencing no pain. The problem affects both sides. She is not pregnant. Pertinent negatives include no anorexia, back pain, chills, discolored urine, hematuria, joint pain, painful intercourse, rash or urgency. The vaginal discharge was thin and white. The vaginal bleeding is typical of menses. She has not been passing clots. She has not been passing tissue. Nothing aggravates the symptoms. She has tried antifungals for the symptoms. The treatment provided mild relief. She is sexually active. It is unknown whether or not her partner has an STD. She uses progestin injections for contraception. Her menstrual history has been regular. Her past medical history is significant for vaginosis. There is no history of PID or an STD.  Depo for contraception, last injection 03/01/22  Reviewed medical, surgical, and social history today  Medications: Outpatient Medications Prior to Visit  Medication Sig   Cetirizine HCl (ZYRTEC PO) Take by mouth.   FLUoxetine (PROZAC) 40 MG capsule TAKE 1 CAPSULE DAILY   fluticasone (FLONASE) 50 MCG/ACT nasal spray 2 sprays per nostril once a day if needed for stuffy nose   medroxyPROGESTERone (DEPO-PROVERA) 150 MG/ML injection Inject 150 mg into the muscle every 3 (three) months.   [DISCONTINUED] azelastine  (ASTELIN) 0.1 % nasal spray 2 sprays per nostril twice a day if needed for sinus headache   [DISCONTINUED] famotidine (PEPCID) 20 MG tablet Take 1 tablet twice a day if needed for heartburn   No facility-administered medications prior to visit.   Reviewed past medical and social history.   ROS per HPI above  {Show previous labs (optional):23779}    Objective:  BP 110/78 (BP Location: Right Arm, Patient Position: Sitting, Cuff Size: Normal)   Pulse 73   Temp 98.1 F (36.7 C) (Temporal)   Ht 5\' 3"  (1.6 m)   Wt 197 lb 3.2 oz (89.4 kg)   SpO2 98%   BMI 34.93 kg/m      Physical Exam Vitals reviewed. Exam conducted with a chaperone present.  Abdominal:     Hernia: There is no hernia in the left inguinal area or right inguinal area.  Genitourinary:    Exam position: Lithotomy position.     Labia:        Right: No rash, tenderness or lesion.        Left: No rash, tenderness or lesion.      Vagina: Vaginal discharge and erythema present. No tenderness or bleeding.     Cervix: Discharge and erythema present. No friability or cervical bleeding.  Lymphadenopathy:     Lower Body: No right inguinal adenopathy. No left inguinal adenopathy.  Neurological:     Mental Status: She is alert.     No results found for any visits on 03/13/22.    Assessment & Plan:    Problem List Items Addressed  This Visit   None Visit Diagnoses     Vaginal itching    -  Primary   Relevant Orders   Cervicovaginal ancillary only( [place])     use monistat cream for vulva itching, while waiting for results.  Return if symptoms worsen or fail to improve.     Alysia Penna, NP

## 2022-03-13 NOTE — Patient Instructions (Signed)
Ok to use monistat cream for vulva itching. You will be contacted with results.

## 2022-03-14 LAB — CERVICOVAGINAL ANCILLARY ONLY
Bacterial Vaginitis (gardnerella): POSITIVE — AB
Candida Glabrata: NEGATIVE
Candida Vaginitis: POSITIVE — AB
Chlamydia: NEGATIVE
Comment: NEGATIVE
Comment: NEGATIVE
Comment: NEGATIVE
Comment: NEGATIVE
Comment: NEGATIVE
Comment: NORMAL
Neisseria Gonorrhea: NEGATIVE
Trichomonas: NEGATIVE

## 2022-03-14 MED ORDER — MICONAZOLE NITRATE 2 % EX CREA: 1.0000 | TOPICAL_CREAM | Freq: Two times a day (BID) | CUTANEOUS | 0 refills | Status: DC

## 2022-03-14 MED ORDER — METRONIDAZOLE 500 MG PO TABS: 500.0000 mg | ORAL_TABLET | Freq: Two times a day (BID) | ORAL | 0 refills | Status: AC

## 2022-05-17 ENCOUNTER — Ambulatory Visit (INDEPENDENT_AMBULATORY_CARE_PROVIDER_SITE_OTHER): Payer: BC Managed Care – PPO

## 2022-05-17 DIAGNOSIS — Z309 Encounter for contraceptive management, unspecified: Secondary | ICD-10-CM | POA: Diagnosis not present

## 2022-05-17 MED ORDER — MEDROXYPROGESTERONE ACETATE 150 MG/ML IM SUSP
150.0000 mg | Freq: Once | INTRAMUSCULAR | Status: AC
  Administered 2022-05-17: 150 mg via INTRAMUSCULAR

## 2022-05-17 NOTE — [place] Notes (Signed)
Patient presents for Depo-Provera injection. Injection placed in left ventrogluteal region. Patient tolerated procedure well with no concerns.

## 2022-08-09 ENCOUNTER — Ambulatory Visit: Payer: BC Managed Care – PPO

## 2022-10-29 ENCOUNTER — Other Ambulatory Visit: Payer: Self-pay | Admitting: Nurse Practitioner

## 2022-10-29 DIAGNOSIS — F331 Major depressive disorder, recurrent, moderate: Secondary | ICD-10-CM

## 2022-11-27 ENCOUNTER — Encounter: Payer: Self-pay | Admitting: Nurse Practitioner

## 2022-11-27 ENCOUNTER — Ambulatory Visit (INDEPENDENT_AMBULATORY_CARE_PROVIDER_SITE_OTHER): Payer: BC Managed Care – PPO | Admitting: Nurse Practitioner

## 2022-11-27 VITALS — BP 120/80 | HR 90 | Temp 98.4°F | Resp 16 | Ht 63.0 in | Wt 208.0 lb

## 2022-11-27 DIAGNOSIS — Z6836 Body mass index (BMI) 36.0-36.9, adult: Secondary | ICD-10-CM

## 2022-11-27 DIAGNOSIS — Z0001 Encounter for general adult medical examination with abnormal findings: Secondary | ICD-10-CM | POA: Diagnosis not present

## 2022-11-27 DIAGNOSIS — R739 Hyperglycemia, unspecified: Secondary | ICD-10-CM

## 2022-11-27 DIAGNOSIS — F3342 Major depressive disorder, recurrent, in full remission: Secondary | ICD-10-CM

## 2022-11-27 DIAGNOSIS — E669 Obesity, unspecified: Secondary | ICD-10-CM | POA: Insufficient documentation

## 2022-11-27 DIAGNOSIS — L210 Seborrhea capitis: Secondary | ICD-10-CM | POA: Diagnosis not present

## 2022-11-27 LAB — COMPREHENSIVE METABOLIC PANEL
ALT: 28 U/L (ref 0–35)
AST: 23 U/L (ref 0–37)
Albumin: 4.5 g/dL (ref 3.5–5.2)
Alkaline Phosphatase: 90 U/L (ref 39–117)
BUN: 8 mg/dL (ref 6–23)
CO2: 24 mEq/L (ref 19–32)
Calcium: 9.6 mg/dL (ref 8.4–10.5)
Chloride: 105 mEq/L (ref 96–112)
Creatinine, Ser: 0.69 mg/dL (ref 0.40–1.20)
GFR: 120.49 mL/min (ref >60.00)
Glucose, Bld: 86 mg/dL (ref 70–99)
Potassium: 3.7 mEq/L (ref 3.5–5.1)
Sodium: 138 mEq/L (ref 135–145)
Total Bilirubin: 0.6 mg/dL (ref 0.2–1.2)
Total Protein: 7 g/dL (ref 6.0–8.3)

## 2022-11-27 LAB — HEMOGLOBIN A1C: Hgb A1c MFr Bld: 5.3 % (ref 4.6–6.5)

## 2022-11-27 MED ORDER — FLUOXETINE HCL 40 MG PO CAPS: 40.0000 mg | ORAL_CAPSULE | Freq: Every day | ORAL | 3 refills | Status: DC

## 2022-11-27 MED ORDER — KETOCONAZOLE 2 % EX SHAM: 1.0000 | MEDICATED_SHAMPOO | CUTANEOUS | 0 refills | Status: DC

## 2022-11-27 NOTE — Patient Instructions (Addendum)
Let me know if insurance will cover referral to nutritionist or wegovy or zepbound. Go to lab Maintain heart healthy diet and daily exercise  Prediabetes Eating Plan Prediabetes is a condition that causes blood sugar (glucose) levels to be higher than normal. This increases the risk for developing type 2 diabetes (type 2 diabetes mellitus). Working with a health care provider or nutrition specialist (dietitian) to make diet and lifestyle changes can help prevent the onset of diabetes. These changes may help you: Control your blood glucose levels. Improve your cholesterol levels. Manage your blood pressure. What are tips for following this plan? Reading food labels Read food labels to check the amount of fat, salt (sodium), and sugar in prepackaged foods. Avoid foods that have: Saturated fats. Trans fats. Added sugars. Avoid foods that have more than 300 milligrams (mg) of sodium per serving. Limit your sodium intake to less than 2,300 mg each day. Shopping Avoid buying pre-made and processed foods. Avoid buying drinks with added sugar. Cooking Cook with olive oil. Do not use butter, lard, or ghee. Bake, broil, grill, steam, or boil foods. Avoid frying. Meal planning  Work with your dietitian to create an eating plan that is right for you. This may include tracking how many calories you take in each day. Use a food diary, notebook, or mobile application to track what you eat at each meal. Consider following a Mediterranean diet. This includes: Eating several servings of fresh fruits and vegetables each day. Eating fish at least twice a week. Eating one serving each day of whole grains, beans, nuts, and seeds. Using olive oil instead of other fats. Limiting alcohol. Limiting red meat. Using nonfat or low-fat dairy products. Consider following a plant-based diet. This includes dietary choices that focus on eating mostly vegetables and fruit, grains, beans, nuts, and seeds. If you have  high blood pressure, you may need to limit your sodium intake or follow a diet such as the DASH (Dietary Approaches to Stop Hypertension) eating plan. The DASH diet aims to lower high blood pressure. Lifestyle Set weight loss goals with help from your health care team. It is recommended that most people with prediabetes lose 7% of their body weight. Exercise for at least 30 minutes 5 or more days a week. Attend a support group or seek support from a mental health counselor. Take over-the-counter and prescription medicines only as told by your health care provider. What foods are recommended? Fruits Berries. Bananas. Apples. Oranges. Grapes. Papaya. Mango. Pomegranate. Kiwi. Grapefruit. Cherries. Vegetables Lettuce. Spinach. Peas. Beets. Cauliflower. Cabbage. Broccoli. Carrots. Tomatoes. Squash. Eggplant. Herbs. Peppers. Onions. Cucumbers. Brussels sprouts. Grains Whole grains, such as whole-wheat or whole-grain breads, crackers, cereals, and pasta. Unsweetened oatmeal. Bulgur. Barley. Quinoa. Brown rice. Corn or whole-wheat flour tortillas or taco shells. Meats and other proteins Seafood. Poultry without skin. Lean cuts of pork and beef. Tofu. Eggs. Nuts. Beans. Dairy Low-fat or fat-free dairy products, such as yogurt, cottage cheese, and cheese. Beverages Water. Tea. Coffee. Sugar-free or diet soda. Seltzer water. Low-fat or nonfat milk. Milk alternatives, such as soy or almond milk. Fats and oils Olive oil. Canola oil. Sunflower oil. Grapeseed oil. Avocado. Walnuts. Sweets and desserts Sugar-free or low-fat pudding. Sugar-free or low-fat ice cream and other frozen treats. Seasonings and condiments Herbs. Sodium-free spices. Mustard. Relish. Low-salt, low-sugar ketchup. Low-salt, low-sugar barbecue sauce. Low-fat or fat-free mayonnaise. The items listed above may not be a complete list of recommended foods and beverages. Contact a dietitian for more information. What foods  are not  recommended? Fruits Fruits canned with syrup. Vegetables Canned vegetables. Frozen vegetables with butter or cream sauce. Grains Refined white flour and flour products, such as bread, pasta, snack foods, and cereals. Meats and other proteins Fatty cuts of meat. Poultry with skin. Breaded or fried meat. Processed meats. Dairy Full-fat yogurt, cheese, or milk. Beverages Sweetened drinks, such as iced tea and soda. Fats and oils Butter. Lard. Ghee. Sweets and desserts Baked goods, such as cake, cupcakes, pastries, cookies, and cheesecake. Seasonings and condiments Spice mixes with added salt. Ketchup. Barbecue sauce. Mayonnaise. The items listed above may not be a complete list of foods and beverages that are not recommended. Contact a dietitian for more information. Where to find more information American Diabetes Association: www.diabetes.org Summary You may need to make diet and lifestyle changes to help prevent the onset of diabetes. These changes can help you control blood sugar, improve cholesterol levels, and manage blood pressure. Set weight loss goals with help from your health care team. It is recommended that most people with prediabetes lose 7% of their body weight. Consider following a Mediterranean diet. This includes eating plenty of fresh fruits and vegetables, whole grains, beans, nuts, seeds, fish, and low-fat dairy, and using olive oil instead of other fats. This information is not intended to replace advice given to you by your health care provider. Make sure you discuss any questions you have with your health care provider. Document Revised: 08/20/2019 Document Reviewed: 08/20/2019 Elsevier Patient Education  2024 [employer].

## 2022-11-27 NOTE — Assessment & Plan Note (Signed)
Stable Sessions with psychologist  Maintain fluoxetine dose 

## 2022-11-27 NOTE — Assessment & Plan Note (Signed)
Encourage to maintain heart healthy diet and daily exercise

## 2022-11-27 NOTE — [place] Notes (Signed)
Complete physical exam  Patient: Kristin Spencer   DOB: [birth year]   26 y.o. Female  MRN: [id number] Visit Date: 11/27/2022  Subjective:    Chief Complaint  Patient presents with   Annual Exam    Fasting    Kristin Spencer is a 27 y.o. female who presents today for a complete physical exam. She reports consuming a general diet.  Walking 2x/week  She generally feels well. She reports sleeping well. She does have additional problems to discuss today.  Vision:Yes Dental:Yes STD Screen:No  BP Readings from Last 3 Encounters:  11/27/22 120/80  03/13/22 110/78  12/13/21 122/74   Wt Readings from Last 3 Encounters:  11/27/22 208 lb (94.3 kg)  03/13/22 197 lb 3.2 oz (89.4 kg)  12/13/21 196 lb (88.9 kg)    Most recent fall risk assessment:    11/27/2022    1:07 PM  Fall Risk   Falls in the past year? 0  Number falls in past yr: 0  Injury with Fall? 0  Risk for fall due to : No Fall Risks  Follow up Falls evaluation completed   Depression screen:Yes - Depression Most recent depression screenings:    11/27/2022    1:32 PM 03/13/2022    9:49 AM  PHQ 2/9 Scores  PHQ - 2 Score 1 0  PHQ- 9 Score 4     HPI  Depression, recurrent (HCC) Stable Sessions with psychologist Maintain fluoxetine dose  Hyperglycemia Repeat hgbA1c  Class 2 severe obesity with serious comorbidity and body mass index (BMI) of 36.0 to 36.9 in adult, unspecified obesity type (HCC) Encourage to maintain heart healthy diet and daily exercise   Past Medical History:  Diagnosis Date   Anxiety    Depression    Past Surgical History:  Procedure Laterality Date   WISDOM TOOTH EXTRACTION     Social History   Socioeconomic History   Marital status: Single    Spouse name: Not on file   Number of children: 0   Years of education: Not on file   Highest education level: Not on file  Occupational History   Not on file  Tobacco Use   Smoking status: Never   Smokeless tobacco: Never   Vaping Use   Vaping Use: Never used  Substance and Sexual Activity   Alcohol use: Yes    Comment: At least a glass of wine over one month   Drug use: Not Currently   Sexual activity: Yes    Birth control/protection: Condom  Other Topics Concern   Not on file  Social History Narrative   ** Merged History Encounter **       Social Determinants of Health   Financial Resource Strain: Not on file  Food Insecurity: Not on file  Transportation Needs: Not on file  Physical Activity: Not on file  Stress: Not on file  Social Connections: Not on file  Intimate Partner Violence: Not on file   Family Status  Relation Name Status   Mother  Alive   Father Noble Alive   Sister  Alive   Mat Aunt  (Not Specified)   Mat Aunt Moms Sister (Not Specified)   Pat Aunt Dad's Aunt (Not Specified)   Pat Aura Camps (Not Specified)   Pat Uncle Sul (Not Specified)   MGF  (Not Specified)   PGM Deb (Not Specified)   PGF Noble (Not Specified)   Other paternal grand Aunt Deceased   Neg Hx  (Not Specified)  Family History  Problem Relation Age of Onset   Diabetes Father    Allergic rhinitis Father    Food Allergy Father    Allergic rhinitis Sister    Cancer Maternal Aunt 45       breast cancer   Cancer Paternal Aunt    Diabetes Paternal Uncle    Diabetes Paternal Uncle    Diabetes Maternal Grandfather    Diabetes Paternal Grandmother    Diabetes Paternal Grandfather    Heart disease Paternal Grandfather    Heart attack Paternal Grandfather    Cancer Other        Breast cancer   Angioedema Neg Hx    Asthma Neg Hx    Eczema Neg Hx    Immunodeficiency Neg Hx    Urticaria Neg Hx    Allergies  Allergen Reactions   Shellfish Allergy Shortness Of Breath and Nausea And Vomiting    Patient Care Team: Jordanne Elsbury, Bonna Gains, NP as PCP - General (Internal Medicine)   Medications: Outpatient Medications Prior to Visit  Medication Sig   Cetirizine HCl (ZYRTEC PO) Take by mouth.    fluticasone (FLONASE) 50 MCG/ACT nasal spray 2 sprays per nostril once a day if needed for stuffy nose   [DISCONTINUED] FLUoxetine (PROZAC) 40 MG capsule TAKE 1 CAPSULE DAILY   [DISCONTINUED] medroxyPROGESTERone (DEPO-PROVERA) 150 MG/ML injection Inject 150 mg into the muscle every 3 (three) months.   [DISCONTINUED] miconazole (MICOTIN) 2 % cream Apply 1 Application topically 2 (two) times daily.   No facility-administered medications prior to visit.    Review of Systems  Constitutional:  Negative for activity change, appetite change and unexpected weight change.  Respiratory: Negative.    Cardiovascular: Negative.   Gastrointestinal: Negative.   Endocrine: Negative for cold intolerance and heat intolerance.  Genitourinary: Negative.   Musculoskeletal: Negative.   Skin: Negative.        Dry and flaky scalp  Neurological: Negative.   Hematological: Negative.   Psychiatric/Behavioral:  Negative for behavioral problems, decreased concentration, dysphoric mood, hallucinations, self-injury, sleep disturbance and suicidal ideas. The patient is not nervous/anxious.        Objective:  BP 120/80 (BP Location: Left Arm, Patient Position: Sitting, Cuff Size: Large)   Pulse 90   Temp 98.4 F (36.9 C) (Temporal)   Resp 16   Ht 5\' 3"  (1.6 m)   Wt 208 lb (94.3 kg)   LMP 11/07/2022   SpO2 99%   BMI 36.85 kg/m     Physical Exam Vitals and nursing note reviewed.  Constitutional:      General: She is not in acute distress. HENT:     Right Ear: Tympanic membrane, ear canal and external ear normal.     Left Ear: Tympanic membrane, ear canal and external ear normal.     Nose: Nose normal.  Eyes:     Extraocular Movements: Extraocular movements intact.     Conjunctiva/sclera: Conjunctivae normal.     Pupils: Pupils are equal, round, and reactive to light.  Neck:     Thyroid: No thyroid mass, thyromegaly or thyroid tenderness.  Cardiovascular:     Rate and Rhythm: Normal rate and regular  rhythm.     Pulses: Normal pulses.     Heart sounds: Normal heart sounds.  Pulmonary:     Effort: Pulmonary effort is normal.     Breath sounds: Normal breath sounds.  Abdominal:     General: Bowel sounds are normal.     Palpations: Abdomen is soft.  Musculoskeletal:        General: Normal range of motion.     Cervical back: Normal range of motion and neck supple.     Right lower leg: No edema.     Left lower leg: No edema.  Lymphadenopathy:     Cervical: No cervical adenopathy.  Skin:    General: Skin is warm and dry.  Neurological:     Mental Status: She is alert and oriented to person, place, and time.     Cranial Nerves: No cranial nerve deficit.  Psychiatric:        Mood and Affect: Mood normal.        Behavior: Behavior normal.        Thought Content: Thought content normal.      No results found for any visits on 11/27/22.    Assessment & Plan:    Routine Health Maintenance and Physical Exam  Immunization History  Administered Date(s) Administered   DTaP 07/27/1997, 10/06/1997, 02/22/1998, 05/03/2000, 06/02/2002   HIB (PRP-T) 07/27/1997, 10/06/1997, 07/19/1998   HPV 9-valent 10/07/2015, 11/14/2020, 05/16/2021   Hepatitis A 10/07/2015   Hepatitis A, Ped/Adol-2 Dose 10/07/2015   Hepatitis B 18-Dec-1996, 05/31/1997, 02/22/1998   Hepatitis B, PED/ADOLESCENT Nov 05, 1996, 05/31/1997, 02/22/1998   IPV 07/27/1997, 10/06/1997, 02/21/1998, 06/02/2002   Influenza Split 03/22/2015   Influenza,inj,Quad PF,6+ Mos 03/22/2015, 05/23/2021, 03/13/2022   MMR 08/17/1998, 06/02/2002   Meningococcal Conjugate 09/22/2013, 10/07/2015   PFIZER(Purple Top)SARS-COV-2 Vaccination 08/15/2019, 09/08/2019   Tdap 01/24/2009, 10/07/2015   Varicella 08/17/1998, 09/22/2013   Health Maintenance  Topic Date Due   COVID-19 Vaccine (3 - 2023-24 season) 12/13/2022 (Originally 02/02/2022)   INFLUENZA VACCINE  01/03/2023   PAP-Cervical Cytology Screening  11/15/2023   PAP SMEAR-Modifier  11/15/2023    DTaP/Tdap/Td (8 - Td or Tdap) 10/06/2025   HPV VACCINES  Completed   Hepatitis C Screening  Completed   HIV Screening  Completed   Discussed health benefits of physical activity, and encouraged her to engage in regular exercise appropriate for her age and condition.  Problem List Items Addressed This Visit       Musculoskeletal and Integument   Dandruff in adult   Relevant Medications   ketoconazole (NIZORAL) 2 % shampoo (Start on 11/29/2022)     Other   Class 2 severe obesity with serious comorbidity and body mass index (BMI) of 36.0 to 36.9 in adult, unspecified obesity type (HCC)    Encourage to maintain heart healthy diet and daily exercise      Depression, recurrent (HCC)    Stable Sessions with psychologist Maintain fluoxetine dose      Relevant Medications   FLUoxetine (PROZAC) 40 MG capsule   Hyperglycemia    Repeat hgbA1c      Relevant Orders   Hemoglobin A1c   Other Visit Diagnoses     Encounter for preventative adult health care exam with abnormal findings    -  Primary   Relevant Orders   Comprehensive metabolic panel      Return in about 1 year (around 11/27/2023) for CPE (fasting).     Alysia Penna, NP

## 2022-11-27 NOTE — Assessment & Plan Note (Signed)
Repeat hgbA1c 

## 2022-11-27 NOTE — Assessment & Plan Note (Signed)
" >>  ASSESSMENT AND PLAN FOR DEPRESSION, RECURRENT WRITTEN ON 11/27/2022  1:32 PM BY Jettson Crable LUM, NP  Stable Sessions with psychologist Maintain fluoxetine  dose "

## 2022-11-28 NOTE — Progress Notes (Signed)
Stable Follow instructions as discussed during office visit.

## 2022-12-13 ENCOUNTER — Other Ambulatory Visit: Payer: Self-pay

## 2022-12-13 ENCOUNTER — Encounter: Payer: Self-pay | Admitting: Nurse Practitioner

## 2022-12-13 MED ORDER — ZEPBOUND 2.5 MG/0.5ML ~~LOC~~ SOAJ: 2.5000 mg | SUBCUTANEOUS | 0 refills | Status: DC

## 2022-12-25 ENCOUNTER — Telehealth: Payer: Self-pay | Admitting: Nurse Practitioner

## 2022-12-25 NOTE — Telephone Encounter (Signed)
[id number] has let pt know, this script is needing a PA before she can get it. If any questions please advise pt at [phone]

## 2022-12-26 ENCOUNTER — Other Ambulatory Visit (HOSPITAL_COMMUNITY): Payer: Self-pay

## 2022-12-26 ENCOUNTER — Telehealth: Payer: Self-pay

## 2022-12-26 NOTE — Telephone Encounter (Signed)
Pharmacy Patient Advocate Encounter   Received notification from Pt Calls Messages that prior authorization for Zepbound 2.5mg /0.38ml is required/requested.   Insurance verification completed.   The patient is insured through [employer] .   Per test claim: PA submitted to [facility] via CoverMyMeds Key/confirmation #/EOC [id number] Status is pending

## 2022-12-27 ENCOUNTER — Other Ambulatory Visit (HOSPITAL_COMMUNITY): Payer: Self-pay

## 2022-12-27 NOTE — Telephone Encounter (Signed)
Notified patient through Mychart

## 2022-12-27 NOTE — Telephone Encounter (Signed)
Pharmacy Patient Advocate Encounter  Received notification from [employer] [facility] that Prior Authorization for Zepbound 2.5mg /0.55ml has been APPROVED from 12/26/22 to 08/23/23. Ran test claim, Copay is $24.99.  PA #/Case ID/Reference #: [id number]  Left a voicemail at [facility] to notify of the approval.

## 2023-01-02 ENCOUNTER — Encounter (INDEPENDENT_AMBULATORY_CARE_PROVIDER_SITE_OTHER): Payer: Self-pay

## 2023-01-25 ENCOUNTER — Encounter: Payer: Self-pay | Admitting: Nurse Practitioner

## 2023-01-25 ENCOUNTER — Ambulatory Visit: Payer: BC Managed Care – PPO | Admitting: Nurse Practitioner

## 2023-01-25 VITALS — BP 120/64 | HR 84 | Temp 97.9°F | Resp 16 | Ht 63.0 in | Wt 199.2 lb

## 2023-01-25 DIAGNOSIS — Z6836 Body mass index (BMI) 36.0-36.9, adult: Secondary | ICD-10-CM | POA: Diagnosis not present

## 2023-01-25 LAB — COMPREHENSIVE METABOLIC PANEL
ALT: 26 U/L (ref 0–35)
AST: 22 U/L (ref 0–37)
Albumin: 4.4 g/dL (ref 3.5–5.2)
Alkaline Phosphatase: 69 U/L (ref 39–117)
BUN: 9 mg/dL (ref 6–23)
CO2: 28 mEq/L (ref 19–32)
Calcium: 9.4 mg/dL (ref 8.4–10.5)
Chloride: 107 mEq/L (ref 96–112)
Creatinine, Ser: 0.74 mg/dL (ref 0.40–1.20)
GFR: 112.2 mL/min (ref >60.00)
Glucose, Bld: 89 mg/dL (ref 70–99)
Potassium: 4.1 mEq/L (ref 3.5–5.1)
Sodium: 142 mEq/L (ref 135–145)
Total Bilirubin: 0.4 mg/dL (ref 0.2–1.2)
Total Protein: 6.8 g/dL (ref 6.0–8.3)

## 2023-01-25 LAB — TSH: TSH: 0.84 u[IU]/mL (ref 0.35–5.50)

## 2023-01-25 MED ORDER — ZEPBOUND 5 MG/0.5ML ~~LOC~~ SOAJ
5.0000 mg | SUBCUTANEOUS | 0 refills | Status: DC
Start: 2023-01-25 — End: 2023-02-20

## 2023-01-25 NOTE — Patient Instructions (Signed)
Go to lab Maintain a balanced diet with 0.8g/kg/day of protein and 64oz of water daily  Calorie Counting for Weight Loss Calories are units of energy. Your body needs a certain number of calories from food to keep going throughout the day. When you eat or drink more calories than your body needs, your body stores the extra calories mostly as fat. When you eat or drink fewer calories than your body needs, your body burns fat to get the energy it needs. Calorie counting means keeping track of how many calories you eat and drink each day. Calorie counting can be helpful if you need to lose weight. If you eat fewer calories than your body needs, you should lose weight. Ask your health care provider what a healthy weight is for you. For calorie counting to work, you will need to eat the right number of calories each day to lose a healthy amount of weight per week. A dietitian can help you figure out how many calories you need in a day and will suggest ways to reach your calorie goal. A healthy amount of weight to lose each week is usually 1-2 lb (0.5-0.9 kg). This usually means that your daily calorie intake should be reduced by 500-750 calories. Eating 1,200-1,500 calories a day can help most women lose weight. Eating 1,500-1,800 calories a day can help most men lose weight. What do I need to know about calorie counting? Work with your health care provider or dietitian to determine how many calories you should get each day. To meet your daily calorie goal, you will need to: Find out how many calories are in each food that you would like to eat. Try to do this before you eat. Decide how much of the food you plan to eat. Keep a food log. Do this by writing down what you ate and how many calories it had. To successfully lose weight, it is important to balance calorie counting with a healthy lifestyle that includes regular activity. Where do I find calorie information?  The number of calories in a food can  be found on a Nutrition Facts label. If a food does not have a Nutrition Facts label, try to look up the calories online or ask your dietitian for help. Remember that calories are listed per serving. If you choose to have more than one serving of a food, you will have to multiply the calories per serving by the number of servings you plan to eat. For example, the label on a package of bread might say that a serving size is 1 slice and that there are 90 calories in a serving. If you eat 1 slice, you will have eaten 90 calories. If you eat 2 slices, you will have eaten 180 calories. How do I keep a food log? After each time that you eat, record the following in your food log as soon as possible: What you ate. Be sure to include toppings, sauces, and other extras on the food. How much you ate. This can be measured in cups, ounces, or number of items. How many calories were in each food and drink. The total number of calories in the food you ate. Keep your food log near you, such as in a pocket-sized notebook or on an app or website on your mobile phone. Some programs will calculate calories for you and show you how many calories you have left to meet your daily goal. What are some portion-control tips? Know how many calories are  in a serving. This will help you know how many servings you can have of a certain food. Use a measuring cup to measure serving sizes. You could also try weighing out portions on a kitchen scale. With time, you will be able to estimate serving sizes for some foods. Take time to put servings of different foods on your favorite plates or in your favorite bowls and cups so you know what a serving looks like. Try not to eat straight from a food's packaging, such as from a bag or box. Eating straight from the package makes it hard to see how much you are eating and can lead to overeating. Put the amount you would like to eat in a cup or on a plate to make sure you are eating the right  portion. Use smaller plates, glasses, and bowls for smaller portions and to prevent overeating. Try not to multitask. For example, avoid watching TV or using your computer while eating. If it is time to eat, sit down at a table and enjoy your food. This will help you recognize when you are full. It will also help you be more mindful of what and how much you are eating. What are tips for following this plan? Reading food labels Check the calorie count compared with the serving size. The serving size may be smaller than what you are used to eating. Check the source of the calories. Try to choose foods that are high in protein, fiber, and vitamins, and low in saturated fat, trans fat, and sodium. Shopping Read nutrition labels while you shop. This will help you make healthy decisions about which foods to buy. Pay attention to nutrition labels for low-fat or fat-free foods. These foods sometimes have the same number of calories or more calories than the full-fat versions. They also often have added sugar, starch, or salt to make up for flavor that was removed with the fat. Make a grocery list of lower-calorie foods and stick to it. Cooking Try to cook your favorite foods in a healthier way. For example, try baking instead of frying. Use low-fat dairy products. Meal planning Use more fruits and vegetables. One-half of your plate should be fruits and vegetables. Include lean proteins, such as chicken, [place], and fish. Lifestyle Each week, aim to do one of the following: 150 minutes of moderate exercise, such as walking. 75 minutes of vigorous exercise, such as running. General information Know how many calories are in the foods you eat most often. This will help you calculate calorie counts faster. Find a way of tracking calories that works for you. Get creative. Try different apps or programs if writing down calories does not work for you. What foods should I eat?  Eat nutritious foods. It is  better to have a nutritious, high-calorie food, such as an avocado, than a food with few nutrients, such as a bag of potato chips. Use your calories on foods and drinks that will fill you up and will not leave you hungry soon after eating. Examples of foods that fill you up are nuts and nut butters, vegetables, lean proteins, and high-fiber foods such as whole grains. High-fiber foods are foods with more than 5 g of fiber per serving. Pay attention to calories in drinks. Low-calorie drinks include water and unsweetened drinks. The items listed above may not be a complete list of foods and beverages you can eat. Contact a dietitian for more information. What foods should I limit? Limit foods or drinks that  are not good sources of vitamins, minerals, or protein or that are high in unhealthy fats. These include: Candy. Other sweets. Sodas, specialty coffee drinks, alcohol, and juice. The items listed above may not be a complete list of foods and beverages you should avoid. Contact a dietitian for more information. How do I count calories when eating out? Pay attention to portions. Often, portions are much larger when eating out. Try these tips to keep portions smaller: Consider sharing a meal instead of getting your own. If you get your own meal, eat only half of it. Before you start eating, ask for a container and put half of your meal into it. When available, consider ordering smaller portions from the menu instead of full portions. Pay attention to your food and drink choices. Knowing the way food is cooked and what is included with the meal can help you eat fewer calories. If calories are listed on the menu, choose the lower-calorie options. Choose dishes that include vegetables, fruits, whole grains, low-fat dairy products, and lean proteins. Choose items that are boiled, broiled, grilled, or steamed. Avoid items that are buttered, battered, fried, or served with cream sauce. Items labeled as  crispy are usually fried, unless stated otherwise. Choose water, low-fat milk, unsweetened iced tea, or other drinks without added sugar. If you want an alcoholic beverage, choose a lower-calorie option, such as a glass of wine or light beer. Ask for dressings, sauces, and syrups on the side. These are usually high in calories, so you should limit the amount you eat. If you want a salad, choose a garden salad and ask for grilled meats. Avoid extra toppings such as bacon, cheese, or fried items. Ask for the dressing on the side, or ask for olive oil and vinegar or lemon to use as dressing. Estimate how many servings of a food you are given. Knowing serving sizes will help you be aware of how much food you are eating at restaurants. Where to find more information Centers for Disease Control and Prevention: FootballExhibition.com.br U.S. Department of Agriculture: WrestlingReporter.dk Summary Calorie counting means keeping track of how many calories you eat and drink each day. If you eat fewer calories than your body needs, you should lose weight. A healthy amount of weight to lose per week is usually 1-2 lb (0.5-0.9 kg). This usually means reducing your daily calorie intake by 500-750 calories. The number of calories in a food can be found on a Nutrition Facts label. If a food does not have a Nutrition Facts label, try to look up the calories online or ask your dietitian for help. Use smaller plates, glasses, and bowls for smaller portions and to prevent overeating. Use your calories on foods and drinks that will fill you up and not leave you hungry shortly after a meal. This information is not intended to replace advice given to you by your health care provider. Make sure you discuss any questions you have with your health care provider. Document Revised: 07/02/2019 Document Reviewed: 07/02/2019 Elsevier Patient Education  2023 [employer].

## 2023-01-25 NOTE — Assessment & Plan Note (Signed)
Lost 9lbs in last 2months with use of zepbound 0.25mg  weekly. Denies any adverse effects. She has also maintained daily exercise (walking daily). Reports high protein diet, but limited fruit and vegetable. Also limited water intake-32oz daily. Wt Readings from Last 3 Encounters:  01/25/23 199 lb 3.2 oz (90.4 kg)  11/27/22 208 lb (94.3 kg)  03/13/22 197 lb 3.2 oz (89.4 kg)    Advised to increase oral hydration and high fiber diet to prevent constipation. Check CMP today Increase zepbound to 5mg  weekly F/up in 10month

## 2023-01-25 NOTE — Progress Notes (Signed)
Established Patient Visit  Patient: Kristin Spencer   DOB: 11-Sep-1995   27 y.o. Female  MRN: [id number] Visit Date: 01/25/2023  Subjective:    Chief Complaint  Patient presents with   Medication Refill    Refill of Zepbound   HPI Class 2 severe obesity with serious comorbidity and body mass index (BMI) of 36.0 to 36.9 in adult, unspecified obesity type (HCC) Lost 9lbs in last 2months with use of zepbound 0.25mg  weekly. Denies any adverse effects. She has also maintained daily exercise (walking daily). Reports high protein diet, but limited fruit and vegetable. Also limited water intake-32oz daily. Wt Readings from Last 3 Encounters:  01/25/23 199 lb 3.2 oz (90.4 kg)  11/27/22 208 lb (94.3 kg)  03/13/22 197 lb 3.2 oz (89.4 kg)    Advised to increase oral hydration and high fiber diet to prevent constipation. Check CMP today Increase zepbound to 5mg  weekly F/up in 27month  Reviewed medical, surgical, and social history today  Medications: Outpatient Medications Prior to Visit  Medication Sig   Cetirizine HCl (ZYRTEC PO) Take by mouth.   FLUoxetine (PROZAC) 40 MG capsule Take 1 capsule (40 mg total) by mouth daily.   fluticasone (FLONASE) 50 MCG/ACT nasal spray 2 sprays per nostril once a day if needed for stuffy nose   ketoconazole (NIZORAL) 2 % shampoo Apply 1 Application topically 2 (two) times a week.   [DISCONTINUED] tirzepatide (ZEPBOUND) 2.5 MG/0.5ML Pen Inject 2.5 mg into the skin once a week.   No facility-administered medications prior to visit.   Reviewed past medical and social history.   ROS per HPI above      Objective:  BP 120/64 (BP Location: Left Arm, Patient Position: Sitting, Cuff Size: Large)   Pulse 84   Temp 97.9 F (36.6 C) (Temporal)   Resp 16   Ht 5\' 3"  (1.6 m)   Wt 199 lb 3.2 oz (90.4 kg)   LMP 01/11/2023   SpO2 99%   BMI 35.29 kg/m      Physical Exam Cardiovascular:     Rate and Rhythm: Normal rate and  regular rhythm.     Pulses: Normal pulses.     Heart sounds: Normal heart sounds.  Pulmonary:     Effort: Pulmonary effort is normal.     Breath sounds: Normal breath sounds.  Neurological:     Mental Status: She is alert and oriented to person, place, and time.  Psychiatric:        Mood and Affect: Mood normal.        Behavior: Behavior normal.        Thought Content: Thought content normal.     No results found for any visits on 01/25/23.    Assessment & Plan:    Problem List Items Addressed This Visit     Class 2 severe obesity with serious comorbidity and body mass index (BMI) of 36.0 to 36.9 in adult, unspecified obesity type (HCC) - Primary    Lost 9lbs in last 2months with use of zepbound 0.25mg  weekly. Denies any adverse effects. She has also maintained daily exercise (walking daily). Reports high protein diet, but limited fruit and vegetable. Also limited water intake-32oz daily. Wt Readings from Last 3 Encounters:  01/25/23 199 lb 3.2 oz (90.4 kg)  11/27/22 208 lb (94.3 kg)  03/13/22 197 lb 3.2 oz (89.4 kg)    Advised to increase oral hydration  and high fiber diet to prevent constipation. Check CMP today Increase zepbound to 5mg  weekly F/up in 26month      Relevant Medications   tirzepatide (ZEPBOUND) 5 MG/0.5ML Pen   Other Relevant Orders   Comprehensive metabolic panel   TSH   Return in about 4 weeks (around 02/22/2023) for Weight management.     Alysia Penna, NP

## 2023-01-29 ENCOUNTER — Ambulatory Visit: Payer: BC Managed Care – PPO | Admitting: Nurse Practitioner

## 2023-02-08 ENCOUNTER — Other Ambulatory Visit: Payer: Self-pay | Admitting: Medical Genetics

## 2023-02-08 DIAGNOSIS — Z006 Encounter for examination for normal comparison and control in clinical research program: Secondary | ICD-10-CM

## 2023-02-20 ENCOUNTER — Encounter: Payer: Self-pay | Admitting: Nurse Practitioner

## 2023-02-20 ENCOUNTER — Ambulatory Visit: Payer: BC Managed Care – PPO | Admitting: Nurse Practitioner

## 2023-02-20 VITALS — BP 110/80 | HR 81 | Temp 98.4°F | Ht 63.0 in | Wt 190.4 lb

## 2023-02-20 DIAGNOSIS — Z7689 Persons encountering health services in other specified circumstances: Secondary | ICD-10-CM

## 2023-02-20 DIAGNOSIS — E6609 Other obesity due to excess calories: Secondary | ICD-10-CM

## 2023-02-20 DIAGNOSIS — E7800 Pure hypercholesterolemia, unspecified: Secondary | ICD-10-CM | POA: Diagnosis not present

## 2023-02-20 DIAGNOSIS — Z6833 Body mass index (BMI) 33.0-33.9, adult: Secondary | ICD-10-CM | POA: Diagnosis not present

## 2023-02-20 DIAGNOSIS — Z23 Encounter for immunization: Secondary | ICD-10-CM | POA: Diagnosis not present

## 2023-02-20 HISTORY — DX: Pure hypercholesterolemia, unspecified: E78.00

## 2023-02-20 MED ORDER — TIRZEPATIDE-WEIGHT MANAGEMENT 7.5 MG/0.5ML ~~LOC~~ SOAJ
7.5000 mg | SUBCUTANEOUS | 0 refills | Status: DC
Start: 2023-02-20 — End: 2023-03-20

## 2023-02-20 NOTE — Assessment & Plan Note (Addendum)
Lost 9lbs in last 4weeks with zepbound 0.5mg  weekly. Denies any adverse effects Total weight loss: 18lbs in last 2months. Diet: low fat diet, known amount of daily protein or carbs or fiber. She eats 2 balanced meals daily Hydration: at least 60oz daily. Also drinks sports drinks and green smoothie daily Exercise: walking daily Sleep: adequate and restless Denies any anxiety and depression No ALCOHOL or caffeine intake Wt Readings from Last 3 Encounters:  02/20/23 190 lb 6.4 oz (86.4 kg)  01/25/23 199 lb 3.2 oz (90.4 kg)  11/27/22 208 lb (94.3 kg)    Advised to avoid all sugary drinks, increased daily dietary intake via fresh vegetables and fruit (25-30g), daily protein intake (96g), maintain 64oz of water daily, to incorporate daily weight training/strength training exercises. Provided printed information on measuring meal portions and what consist of a balanced diet. Increased zepbound dose to 7.5mg  weekly F/up in 1month

## 2023-02-20 NOTE — Patient Instructions (Addendum)
Maintain 69g of protein daily, 25-30g of dietary fiber, 64oz of water daily. Avoid all sugary drinks. Add daily weight training or strength training exercise.

## 2023-02-20 NOTE — Progress Notes (Signed)
Established Patient Visit  Patient: Kristin Spencer   DOB: August 19, 1995   27 y.o. Female  MRN: [id number] Visit Date: 02/20/2023  Subjective:    Chief Complaint  Patient presents with   Medication Management    With taking the Zepbound and flu vaccine   HPI Obesity Lost 9lbs in last 4weeks with zepbound 0.5mg  weekly. Denies any adverse effects Total weight loss: 18lbs in last 2months. Diet: low fat diet, known amount of daily protein or carbs or fiber. She eats 2 balanced meals daily Hydration: at least 60oz daily. Also drinks sports drinks and green smoothie daily Exercise: walking daily Sleep: adequate and restless Denies any anxiety and depression No ALCOHOL or caffeine intake Wt Readings from Last 3 Encounters:  02/20/23 190 lb 6.4 oz (86.4 kg)  01/25/23 199 lb 3.2 oz (90.4 kg)  11/27/22 208 lb (94.3 kg)    Advised to avoid all sugary drinks, increased daily dietary intake via fresh vegetables and fruit (25-30g), daily protein intake (96g), maintain 64oz of water daily, to incorporate daily weight training/strength training exercises. Provided printed information on measuring meal portions and what consist of a balanced diet. Increased zepbound dose to 7.5mg  weekly F/up in 65month  BP Readings from Last 3 Encounters:  02/20/23 110/80  01/25/23 120/64  11/27/22 120/80     Reviewed medical, surgical, and social history today  Medications: Outpatient Medications Prior to Visit  Medication Sig   Cetirizine HCl (ZYRTEC PO) Take by mouth.   FLUoxetine (PROZAC) 40 MG capsule Take 1 capsule (40 mg total) by mouth daily.   fluticasone (FLONASE) 50 MCG/ACT nasal spray 2 sprays per nostril once a day if needed for stuffy nose   ketoconazole (NIZORAL) 2 % shampoo Apply 1 Application topically 2 (two) times a week.   [DISCONTINUED] tirzepatide (ZEPBOUND) 5 MG/0.5ML Pen Inject 5 mg into the skin once a week.   No facility-administered medications prior to  visit.   Reviewed past medical and social history.   ROS per HPI above  Last metabolic panel Lab Results  Component Value Date   GLUCOSE 89 01/25/2023   NA 142 01/25/2023   K 4.1 01/25/2023   CL 107 01/25/2023   CO2 28 01/25/2023   BUN 9 01/25/2023   CREATININE 0.74 01/25/2023   GFR 112.20 01/25/2023   CALCIUM 9.4 01/25/2023   PROT 6.8 01/25/2023   ALBUMIN 4.4 01/25/2023   BILITOT 0.4 01/25/2023   ALKPHOS 69 01/25/2023   AST 22 01/25/2023   ALT 26 01/25/2023   Last lipids Lab Results  Component Value Date   CHOL 125 11/20/2021   HDL 37.30 (L) 11/20/2021   LDLCALC 69 11/20/2021   TRIG 93.0 11/20/2021   CHOLHDL 3 11/20/2021   Last hemoglobin A1c Lab Results  Component Value Date   HGBA1C 5.3 11/27/2022      Objective:  BP 110/80 (BP Location: Right Arm)   Pulse 81   Temp 98.4 F (36.9 C)   Ht 5\' 3"  (1.6 m)   Wt 190 lb 6.4 oz (86.4 kg)   LMP 01/28/2023 (Approximate)   SpO2 99%   BMI 33.73 kg/m      Physical Exam Vitals and nursing note reviewed.  Cardiovascular:     Rate and Rhythm: Normal rate and regular rhythm.     Pulses: Normal pulses.     Heart sounds: Normal heart sounds.  Pulmonary:     Effort:  Pulmonary effort is normal.     Breath sounds: Normal breath sounds.  Neurological:     Mental Status: She is alert and oriented to person, place, and time.     No results found for any visits on 02/20/23.    Assessment & Plan:    Problem List Items Addressed This Visit     Hypercholesterolemia   Relevant Medications   tirzepatide (ZEPBOUND) 7.5 MG/0.5ML Pen   Obesity - Primary    Lost 9lbs in last 4weeks with zepbound 0.5mg  weekly. Denies any adverse effects Total weight loss: 18lbs in last 2months. Diet: low fat diet, known amount of daily protein or carbs or fiber. She eats 2 balanced meals daily Hydration: at least 60oz daily. Also drinks sports drinks and green smoothie daily Exercise: walking daily Sleep: adequate and  restless Denies any anxiety and depression No ALCOHOL or caffeine intake Wt Readings from Last 3 Encounters:  02/20/23 190 lb 6.4 oz (86.4 kg)  01/25/23 199 lb 3.2 oz (90.4 kg)  11/27/22 208 lb (94.3 kg)    Advised to avoid all sugary drinks, increased daily dietary intake via fresh vegetables and fruit (25-30g), daily protein intake (96g), maintain 64oz of water daily, to incorporate daily weight training/strength training exercises. Provided printed information on measuring meal portions and what consist of a balanced diet. Increased zepbound dose to 7.5mg  weekly F/up in 51month      Relevant Medications   tirzepatide (ZEPBOUND) 7.5 MG/0.5ML Pen   Other Visit Diagnoses     Encounter for weight management       Relevant Medications   tirzepatide (ZEPBOUND) 7.5 MG/0.5ML Pen   Immunization due       Relevant Orders   Flu vaccine trivalent PF, 6mos and older(Flulaval,Afluria,Fluarix,Fluzone) (Completed)      Return in about 4 weeks (around 03/20/2023) for Weight management.     Alysia Penna, NP

## 2023-02-22 ENCOUNTER — Ambulatory Visit: Payer: BC Managed Care – PPO | Admitting: Nurse Practitioner

## 2023-03-20 ENCOUNTER — Ambulatory Visit: Payer: BC Managed Care – PPO | Admitting: Nurse Practitioner

## 2023-03-20 ENCOUNTER — Encounter: Payer: Self-pay | Admitting: Nurse Practitioner

## 2023-03-20 VITALS — BP 110/80 | HR 83 | Temp 98.1°F | Ht 63.0 in | Wt 187.6 lb

## 2023-03-20 DIAGNOSIS — E7800 Pure hypercholesterolemia, unspecified: Secondary | ICD-10-CM

## 2023-03-20 DIAGNOSIS — Z7689 Persons encountering health services in other specified circumstances: Secondary | ICD-10-CM

## 2023-03-20 DIAGNOSIS — Z6833 Body mass index (BMI) 33.0-33.9, adult: Secondary | ICD-10-CM

## 2023-03-20 DIAGNOSIS — E66811 Obesity, class 1: Secondary | ICD-10-CM

## 2023-03-20 DIAGNOSIS — E6609 Other obesity due to excess calories: Secondary | ICD-10-CM

## 2023-03-20 MED ORDER — TIRZEPATIDE-WEIGHT MANAGEMENT 7.5 MG/0.5ML ~~LOC~~ SOAJ
7.5000 mg | SUBCUTANEOUS | 1 refills | Status: DC
Start: 2023-03-20 — End: 2023-04-01

## 2023-03-20 NOTE — Patient Instructions (Signed)
Maintain current med dose Maintain at least balanced meals and of moderate intensity exercise.

## 2023-03-20 NOTE — Progress Notes (Signed)
Established Patient Visit  Patient: Kristin Spencer   DOB: January 10, 1996   26 y.o. Female  MRN: [id number] Visit Date: 03/20/2023  Subjective:    Chief Complaint  Patient presents with   Medication Consultation    Discuss zepbound   HPI Obesity Lost 3lbs in last 17month Total weight loss x 3months: 21lbs Denies any adverse effects with zepbound 7.5mg  weekly Eats daily and 1protein drink Exercise: not consistent Wt Readings from Last 3 Encounters:  03/20/23 187 lb 9.6 oz (85.1 kg)  02/20/23 190 lb 6.4 oz (86.4 kg)  01/25/23 199 lb 3.2 oz (90.4 kg)    Maintain med dose Encouraged to maintain 2 balanced meals daily and of moderate intensity exercise daily. F/up in 2months  Wt Readings from Last 3 Encounters:  03/20/23 187 lb 9.6 oz (85.1 kg)  02/20/23 190 lb 6.4 oz (86.4 kg)  01/25/23 199 lb 3.2 oz (90.4 kg)    Reviewed medical, surgical, and social history today  Medications: Outpatient Medications Prior to Visit  Medication Sig   Cetirizine HCl (ZYRTEC PO) Take by mouth.   FLUoxetine (PROZAC) 40 MG capsule Take 1 capsule (40 mg total) by mouth daily.   fluticasone (FLONASE) 50 MCG/ACT nasal spray 2 sprays per nostril once a day if needed for stuffy nose   ketoconazole (NIZORAL) 2 % shampoo Apply 1 Application topically 2 (two) times a week.   [DISCONTINUED] tirzepatide (ZEPBOUND) 7.5 MG/0.5ML Pen Inject 7.5 mg into the skin once a week.   No facility-administered medications prior to visit.   Reviewed past medical and social history.   ROS per HPI above  Last metabolic panel Lab Results  Component Value Date   GLUCOSE 89 01/25/2023   NA 142 01/25/2023   K 4.1 01/25/2023   CL 107 01/25/2023   CO2 28 01/25/2023   BUN 9 01/25/2023   CREATININE 0.74 01/25/2023   GFR 112.20 01/25/2023   CALCIUM 9.4 01/25/2023   PROT 6.8 01/25/2023   ALBUMIN 4.4 01/25/2023   BILITOT 0.4 01/25/2023   ALKPHOS 69 01/25/2023   AST 22 01/25/2023    ALT 26 01/25/2023   Last lipids Lab Results  Component Value Date   CHOL 125 11/20/2021   HDL 37.30 (L) 11/20/2021   LDLCALC 69 11/20/2021   TRIG 93.0 11/20/2021   CHOLHDL 3 11/20/2021   Last hemoglobin A1c Lab Results  Component Value Date   HGBA1C 5.3 11/27/2022        Objective:  BP 110/80   Pulse 83   Temp 98.1 F (36.7 C) (Temporal)   Ht 5\' 3"  (1.6 m)   Wt 187 lb 9.6 oz (85.1 kg)   LMP 03/06/2023 (Approximate)   SpO2 99%   BMI 33.23 kg/m      Physical Exam Vitals and nursing note reviewed.  Cardiovascular:     Rate and Rhythm: Normal rate.     Pulses: Normal pulses.  Pulmonary:     Effort: Pulmonary effort is normal.  Neurological:     Mental Status: She is alert and oriented to person, place, and time.  Psychiatric:        Mood and Affect: Mood normal.        Behavior: Behavior normal.        Thought Content: Thought content normal.     No results found for any visits on 03/20/23.    Assessment & Plan:  Problem List Items Addressed This Visit     Hypercholesterolemia   Relevant Medications   tirzepatide (ZEPBOUND) 7.5 MG/0.5ML Pen   Obesity - Primary    Lost 3lbs in last 16month Total weight loss x 3months: 21lbs Denies any adverse effects with zepbound 7.5mg  weekly Eats daily and 1protein drink Exercise: not consistent Wt Readings from Last 3 Encounters:  03/20/23 187 lb 9.6 oz (85.1 kg)  02/20/23 190 lb 6.4 oz (86.4 kg)  01/25/23 199 lb 3.2 oz (90.4 kg)    Maintain med dose Encouraged to maintain 2 balanced meals daily and of moderate intensity exercise daily. F/up in 2months      Relevant Medications   tirzepatide (ZEPBOUND) 7.5 MG/0.5ML Pen   Other Visit Diagnoses     Encounter for weight management       Relevant Medications   tirzepatide (ZEPBOUND) 7.5 MG/0.5ML Pen      Return in about 2 months (around 05/20/2023) for Weight management.     Alysia Penna, NP

## 2023-03-20 NOTE — Assessment & Plan Note (Addendum)
Lost 3lbs in last 61month Total weight loss x 3months: 21lbs Denies any adverse effects with zepbound 7.5mg  weekly Eats daily and 1protein drink Exercise: not consistent Wt Readings from Last 3 Encounters:  03/20/23 187 lb 9.6 oz (85.1 kg)  02/20/23 190 lb 6.4 oz (86.4 kg)  01/25/23 199 lb 3.2 oz (90.4 kg)    Maintain med dose Encouraged to maintain 2 balanced meals daily and of moderate intensity exercise daily. F/up in 2months

## 2023-03-27 ENCOUNTER — Telehealth: Payer: Self-pay | Admitting: Nurse Practitioner

## 2023-03-27 DIAGNOSIS — Z7689 Persons encountering health services in other specified circumstances: Secondary | ICD-10-CM

## 2023-03-27 DIAGNOSIS — E7800 Pure hypercholesterolemia, unspecified: Secondary | ICD-10-CM

## 2023-03-27 DIAGNOSIS — E6609 Other obesity due to excess calories: Secondary | ICD-10-CM

## 2023-03-27 NOTE — Telephone Encounter (Signed)
Can we get a prior auth on tirzepatide (ZEPBOUND) 7.5 MG/0.5ML Pen ?

## 2023-03-27 NOTE — Telephone Encounter (Signed)
03/27/23 pt called saying her insurance company needs a prior authorization sent to them by the provider for her medication tirzepatide (ZEPBOUND) 7.5 MG/0.5ML Pen [[id number]]

## 2023-03-28 ENCOUNTER — Other Ambulatory Visit (HOSPITAL_COMMUNITY): Payer: Self-pay

## 2023-03-28 NOTE — Telephone Encounter (Signed)
Per patient's insurance, plan limitations have been exceeded with Zepbound 7.5mg .   Her insurance expects her to go up to the next strength and will cover the 10mg  at a copay of $24.99 (including an e-voucher with [facility]).  If patient needs to stay at the 7.5mg  strength, please write a letter of medical necessity and submit an appeal.

## 2023-04-01 ENCOUNTER — Other Ambulatory Visit (HOSPITAL_COMMUNITY): Payer: Self-pay

## 2023-04-01 MED ORDER — TIRZEPATIDE-WEIGHT MANAGEMENT 7.5 MG/0.5ML ~~LOC~~ SOAJ
7.5000 mg | SUBCUTANEOUS | 1 refills | Status: DC
Start: 2023-04-01 — End: 2023-04-15

## 2023-04-01 NOTE — Addendum Note (Signed)
Addended by: Alysia Penna L on: 04/01/2023 03:10 PM   Modules accepted: Orders

## 2023-04-02 ENCOUNTER — Telehealth: Payer: Self-pay

## 2023-04-02 ENCOUNTER — Other Ambulatory Visit (HOSPITAL_COMMUNITY): Payer: Self-pay

## 2023-04-02 DIAGNOSIS — E7800 Pure hypercholesterolemia, unspecified: Secondary | ICD-10-CM

## 2023-04-02 DIAGNOSIS — Z7689 Persons encountering health services in other specified circumstances: Secondary | ICD-10-CM

## 2023-04-02 DIAGNOSIS — E66811 Obesity, class 1: Secondary | ICD-10-CM

## 2023-04-02 NOTE — Telephone Encounter (Signed)
Pharmacy Patient Advocate Encounter   Received notification from Pt Calls Messages that prior authorization for Zepbound 7.5mg /0.74ml is required/requested.   Insurance verification completed.   The patient is insured through [employer] .   Per test claim: PA required; PA submitted to [employer] via Fax Key/confirmation #/EOC [id number] Status is pending   Phone # [phone] Fax # [phone]

## 2023-04-10 ENCOUNTER — Other Ambulatory Visit (HOSPITAL_COMMUNITY): Payer: Self-pay

## 2023-04-10 NOTE — Telephone Encounter (Signed)
Placed a call to the insurance to follow up on the PA. Per the representative, the status is pending.  Phone # [phone] Fax # [phone]  Case ID: [id number]

## 2023-04-11 NOTE — Telephone Encounter (Signed)
Pharmacy Patient Advocate Encounter  Received notification from [employer] that Prior Authorization for Zepbound 7.5mg /0.49ml has been DENIED.  Full denial letter will be uploaded to the media tab. See denial reason below.   PA #/Case ID/Reference #: [id number]    Please be advised we currently do not have a Pharmacist to review denials, therefore you will need to process appeals accordingly as needed. Thanks for your support at this time. Contact for appeals are as follows: Phone: [phone], Fax: [phone]

## 2023-04-15 MED ORDER — ZEPBOUND 10 MG/0.5ML ~~LOC~~ SOAJ
10.0000 mg | SUBCUTANEOUS | 2 refills | Status: DC
Start: 2023-04-15 — End: 2023-08-21

## 2023-04-15 NOTE — Addendum Note (Signed)
Addended by: Alysia Penna L on: 04/15/2023 03:16 PM   Modules accepted: Orders

## 2023-04-15 NOTE — Telephone Encounter (Signed)
Change in dose to 10mg  weekly, because her insurance will not cover 7.5mg . The pharmacy will not dispense more than 2ml of this medication due to medication shortage. I sent zepbound 10mg , quantity 2ml with 2 refills PA approval ID [id number]

## 2023-04-16 NOTE — Telephone Encounter (Signed)
Spoke to patient and informed her that a change in dosage of 10 mg weekly due to insurance not covering 7.5 mg with no more than 2 ml due to pharmacy shortage. Patient verbalized understanding and a thank for update.

## 2023-08-11 DIAGNOSIS — Z113 Encounter for screening for infections with a predominantly sexual mode of transmission: Secondary | ICD-10-CM | POA: Diagnosis not present

## 2023-08-12 ENCOUNTER — Other Ambulatory Visit (HOSPITAL_COMMUNITY): Payer: Self-pay

## 2023-08-12 ENCOUNTER — Telehealth: Payer: Self-pay

## 2023-08-12 NOTE — Telephone Encounter (Signed)
 Pharmacy Patient Advocate Encounter   Received notification from CoverMyMeds that prior authorization for Zepbound 10MG /0.5ML pen-injectors  is required/requested.   Insurance verification completed.   The patient is insured through [facility] .   Per test claim: PA required; PA submitted to above mentioned insurance via CoverMyMeds Key/confirmation #/EOC (Key: [id number])  Status is pending    Please note that the pt has different insurance

## 2023-08-14 NOTE — Telephone Encounter (Signed)
 Pharmacy Patient Advocate Encounter   Please note that the pt has different insurance/Medicaid and must have a trial and failure of wegovy on file/reason for denial   Received notification from [facility] that Prior Authorization for  Zepbound 10MG /0.5ML pen-injectors  has been DENIED.  Full denial letter will be uploaded to the media tab. See denial reason below.

## 2023-08-20 NOTE — Telephone Encounter (Signed)
 Called patient and left a detailed voice message per DPR on file asking to give me a call back at [phone]. I wil try calling patient again.

## 2023-08-20 NOTE — Telephone Encounter (Signed)
 Returned call to patient and she was previously scheduled for tomorrow 08/21/23 at 9:20 AM with Claris Gower. Patient asked if she could have a virtual visit since she is not feeling well. I informed her that since this office visit is to discuss medication for weight loss that she will need to come into the office to get an accurate weight and height. I also placed patient on hold to confirm with Claris Gower that I was telling her the correct information. Once confirmed I resumed my call with the patient and she asked if she can have both issues the nasal congestion and the medication changes. I informed her that she can be seen for both at tomorrow's appointment and to please come in wearing a mask. She thanked me for calling and all (if any) questions were answered.

## 2023-08-21 ENCOUNTER — Encounter: Payer: Self-pay | Admitting: Nurse Practitioner

## 2023-08-21 ENCOUNTER — Ambulatory Visit: Admitting: Nurse Practitioner

## 2023-08-21 VITALS — BP 120/78 | HR 92 | Temp 98.3°F | Ht 63.0 in | Wt 189.2 lb

## 2023-08-21 DIAGNOSIS — R7301 Impaired fasting glucose: Secondary | ICD-10-CM | POA: Diagnosis not present

## 2023-08-21 DIAGNOSIS — E66811 Obesity, class 1: Secondary | ICD-10-CM | POA: Diagnosis not present

## 2023-08-21 DIAGNOSIS — E6609 Other obesity due to excess calories: Secondary | ICD-10-CM | POA: Diagnosis not present

## 2023-08-21 DIAGNOSIS — Z6833 Body mass index (BMI) 33.0-33.9, adult: Secondary | ICD-10-CM

## 2023-08-21 DIAGNOSIS — J069 Acute upper respiratory infection, unspecified: Secondary | ICD-10-CM

## 2023-08-21 DIAGNOSIS — E7800 Pure hypercholesterolemia, unspecified: Secondary | ICD-10-CM | POA: Diagnosis not present

## 2023-08-21 LAB — POCT INFLUENZA A/B
Influenza A, POC: NEGATIVE
Influenza B, POC: NEGATIVE

## 2023-08-21 LAB — LIPID PANEL
Cholesterol: 139 mg/dL (ref 0–200)
HDL: 52.4 mg/dL (ref >39.00)
LDL Cholesterol: 74 mg/dL (ref 0–99)
NonHDL: 86.18
Total CHOL/HDL Ratio: 3
Triglycerides: 59 mg/dL (ref 0.0–149.0)
VLDL: 11.8 mg/dL (ref 0.0–40.0)

## 2023-08-21 LAB — HEMOGLOBIN A1C: Hgb A1c MFr Bld: 5.4 % (ref 4.6–6.5)

## 2023-08-21 LAB — POC COVID19 BINAXNOW: SARS Coronavirus 2 Ag: NEGATIVE

## 2023-08-21 MED ORDER — WEGOVY 2.4 MG/0.75ML ~~LOC~~ SOAJ
2.4000 mg | SUBCUTANEOUS | 0 refills | Status: DC
Start: 2023-08-21 — End: 2023-08-21

## 2023-08-21 MED ORDER — WEGOVY 0.25 MG/0.5ML ~~LOC~~ SOAJ: 0.2500 mg | SUBCUTANEOUS | 0 refills | Status: DC

## 2023-08-21 MED ORDER — FLUTICASONE PROPIONATE 50 MCG/ACT NA SUSP: NASAL | 5 refills | Status: AC

## 2023-08-21 NOTE — Patient Instructions (Addendum)
 Go to lab Negative flu and COVID Ok to use flonase and astepro for nasal congestion Avoid decongestants if you have high blood pressure.  Ok to use Coricidin HBP for chest and sinus congestion Use over-the-counter  cold" medicine  such as Dayquil/Nyquil/Theraflu/Alkaseltzer for cough and sinus congestion. Use mucinex DM or Robitussin  or delsym for cough without sinus congestion  You can use plain "Tylenol" or "Advil" for fever, chills and achyness. Use cool mist humidifier at bedtime to help with nasal congestion and cough.  Cold/cough medications may have tylenol or ibuprofen or guaifenesin or dextromethophan in them, so be careful not to take beyond the recommended dose for each of these medications.   "Common cold" symptoms are usually triggered by a virus.  The antibiotics are usually not necessary. On average, a" viral cold" illness may take 7-10 days to resolve. Please, make an appointment if you are not better or if you're worse.

## 2023-08-21 NOTE — Progress Notes (Signed)
 Established Patient Visit  Patient: Kristin Spencer   DOB: [birth year]   27 y.o. Female  MRN: [id number] Visit Date: 08/21/2023  Subjective:    Chief Complaint  Patient presents with   Medication Management    Changes to medication for Zepbound  Refills requested for Prozac and allergy medication    Nasal Congestion     Nasal congestion/pressure x2 days    URI  This is a new problem. The current episode started in the past 7 days. The problem has been unchanged. There has been no fever. Associated symptoms include congestion, ear pain, rhinorrhea and sinus pain. Pertinent negatives include no abdominal pain, chest pain, coughing, diarrhea, dysuria, headaches, joint pain, joint swelling, nausea, neck pain, plugged ear sensation, rash, sneezing, sore throat, swollen glands, vomiting or wheezing. She has tried antihistamine, decongestant and acetaminophen for the symptoms. The treatment provided mild relief.   Obesity Last zepbound dose in December, stop due to change in insurance. Reports mild GERD with zepbound Continues to struggle with maintain ing low fat diet Exercise:walking daily. 2lbs weight gain in last 5months Wt Readings from Last 3 Encounters:  08/21/23 189 lb 3.2 oz (85.8 kg)  03/20/23 187 lb 9.6 oz (85.1 kg)  02/20/23 190 lb 6.4 oz (86.4 kg)     Impaired fasting glucose Repeat hgbA1c Entered nutritionist referral  Hypercholesterolemia Repeat lipid panel  Reviewed medical, surgical, and social history today  Medications: Outpatient Medications Prior to Visit  Medication Sig   Cetirizine HCl (ZYRTEC PO) Take by mouth.   FLUoxetine (PROZAC) 40 MG capsule Take 1 capsule (40 mg total) by mouth daily.   ketoconazole (NIZORAL) 2 % shampoo Apply 1 Application topically 2 (two) times a week.   [DISCONTINUED] fluticasone (FLONASE) 50 MCG/ACT nasal spray 2 sprays per nostril once a day if needed for stuffy nose   [DISCONTINUED] tirzepatide  (ZEPBOUND) 10 MG/0.5ML Pen Inject 10 mg into the skin once a week. (Patient not taking: Reported on 08/21/2023)   No facility-administered medications prior to visit.   Reviewed past medical and social history.   ROS per HPI above      Objective:  BP 120/78 (BP Location: Left Arm, Patient Position: Sitting, Cuff Size: Large)   Pulse 92   Temp 98.3 F (36.8 C) (Temporal)   Ht 5\' 3"  (1.6 m)   Wt 189 lb 3.2 oz (85.8 kg)   LMP 08/06/2023 (Exact Date)   SpO2 99%   BMI 33.52 kg/m      Physical Exam Vitals and nursing note reviewed.  Constitutional:      General: She is not in acute distress. HENT:     Right Ear: Tympanic membrane, ear canal and external ear normal.     Left Ear: Tympanic membrane, ear canal and external ear normal.     Nose: Congestion and rhinorrhea present. No nasal tenderness or mucosal edema.     Right Nostril: No occlusion.     Left Nostril: No occlusion.     Right Turbinates: Not enlarged, swollen or pale.     Left Turbinates: Not enlarged, swollen or pale.     Right Sinus: No maxillary sinus tenderness or frontal sinus tenderness.     Left Sinus: No maxillary sinus tenderness or frontal sinus tenderness.     Mouth/Throat:     Pharynx: Oropharynx is clear. Uvula midline.     Tonsils: No tonsillar exudate or tonsillar abscesses.  Eyes:     Extraocular Movements: Extraocular movements intact.     Conjunctiva/sclera: Conjunctivae normal.  Cardiovascular:     Rate and Rhythm: Normal rate and regular rhythm.     Pulses: Normal pulses.     Heart sounds: Normal heart sounds.  Pulmonary:     Effort: Pulmonary effort is normal.     Breath sounds: Normal breath sounds.  Musculoskeletal:     Cervical back: Normal range of motion and neck supple.  Lymphadenopathy:     Cervical: No cervical adenopathy.  Neurological:     Mental Status: She is alert and oriented to person, place, and time.     Results for orders placed or performed in visit on 08/21/23   POC COVID-19  Result Value Ref Range   SARS Coronavirus 2 Ag Negative Negative  POCT Influenza A/B  Result Value Ref Range   Influenza A, POC Negative Negative   Influenza B, POC Negative Negative      Assessment & Plan:    Problem List Items Addressed This Visit     Hypercholesterolemia   Repeat lipid panel      Relevant Medications   Semaglutide-Weight Management (WEGOVY) 0.25 MG/0.5ML SOAJ   Other Relevant Orders   Referral to Nutrition and Diabetes Services   Lipid panel   Impaired fasting glucose   Repeat hgbA1c Entered nutritionist referral      Relevant Medications   Semaglutide-Weight Management (WEGOVY) 0.25 MG/0.5ML SOAJ   Other Relevant Orders   Referral to Nutrition and Diabetes Services   Hemoglobin A1c   Obesity - Primary   Last zepbound dose in December, stop due to change in insurance. Reports mild GERD with zepbound Continues to struggle with maintain ing low fat diet Exercise:walking daily. 2lbs weight gain in last 5months Wt Readings from Last 3 Encounters:  08/21/23 189 lb 3.2 oz (85.8 kg)  03/20/23 187 lb 9.6 oz (85.1 kg)  02/20/23 190 lb 6.4 oz (86.4 kg)         Relevant Medications   Semaglutide-Weight Management (WEGOVY) 0.25 MG/0.5ML SOAJ   Other Relevant Orders   Referral to Nutrition and Diabetes Services   Other Visit Diagnoses       Viral upper respiratory tract infection       Relevant Medications   fluticasone (FLONASE) 50 MCG/ACT nasal spray   Other Relevant Orders   POC COVID-19 (Completed)   POCT Influenza A/B (Completed)     Avoid decongestants if you have high blood pressure.  Ok to use Coricidin HBP for chest and sinus congestion Use over-the-counter  cold" medicine  such as Dayquil/Nyquil/Theraflu/Alkaseltzer for cough and sinus congestion. Use mucinex DM or Robitussin  or delsym for cough without sinus congestion  You can use plain "Tylenol" or "Advil" for fever, chills and achyness. Use cool mist  humidifier at bedtime to help with nasal congestion and cough. Cold/cough medications may have tylenol or ibuprofen or guaifenesin or dextromethophan in them, so be careful not to take beyond the recommended dose for each of these medications.  Return in about 4 weeks (around 09/18/2023) for Weight management.     Alysia Penna, NP

## 2023-08-21 NOTE — Assessment & Plan Note (Signed)
 Repeat lipid panel ?

## 2023-08-21 NOTE — Assessment & Plan Note (Signed)
 Repeat hgbA1c Entered nutritionist referral

## 2023-08-21 NOTE — Assessment & Plan Note (Addendum)
 Last zepbound dose in December, stop due to change in insurance. Reports mild GERD with zepbound Continues to struggle with maintain ing low fat diet Exercise:walking daily. 2lbs weight gain in last 5months Wt Readings from Last 3 Encounters:  08/21/23 189 lb 3.2 oz (85.8 kg)  03/20/23 187 lb 9.6 oz (85.1 kg)  02/20/23 190 lb 6.4 oz (86.4 kg)

## 2023-08-23 ENCOUNTER — Other Ambulatory Visit: Payer: Self-pay | Admitting: Nurse Practitioner

## 2023-08-23 DIAGNOSIS — F3342 Major depressive disorder, recurrent, in full remission: Secondary | ICD-10-CM

## 2023-08-23 NOTE — Telephone Encounter (Signed)
 Copied from CRM [id number]. Topic: Clinical - Medication Refill >> Aug 23, 2023  1:27 PM Sim Boast F wrote: Most Recent Primary Care Visit:  Provider: Anne Ng  Department: [facility]  Visit Type: OFFICE VISIT  Date: 03/20/2023  Medication: FLUoxetine   Has the patient contacted their pharmacy? Yes (Agent: If no, request that the patient contact the pharmacy for the refill. If patient does not wish to contact the pharmacy document the reason why and proceed with request.) (Agent: If yes, when and what did the pharmacy advise?)  Is this the correct pharmacy for this prescription? Yes If no, delete pharmacy and type the correct one.  This is the patient's preferred pharmacy:    [facility] [address] - [place], [place] - [address], [address] [place] [address] Phone: [phone] Fax: [phone]   Has the prescription been filled recently? Yes  Is the patient out of the medication? Yes  Has the patient been seen for an appointment in the last year OR does the patient have an upcoming appointment? Yes  Can we respond through MyChart? Yes  Agent: Please be advised that Rx refills may take up to 3 business days. We ask that you follow-up with your pharmacy.

## 2023-08-23 NOTE — Telephone Encounter (Signed)
 Requesting: FLUoxetine (PROZAC) 40 MG capsule  Last Visit: 08/21/2023 Next Visit: Visit date not found Last Refill: 11/27/22  Please Advise

## 2023-08-25 MED ORDER — FLUOXETINE HCL 40 MG PO CAPS: 40.0000 mg | ORAL_CAPSULE | Freq: Every day | ORAL | 3 refills | Status: DC

## 2023-09-08 ENCOUNTER — Other Ambulatory Visit: Payer: Self-pay | Admitting: Nurse Practitioner

## 2023-09-08 DIAGNOSIS — E7800 Pure hypercholesterolemia, unspecified: Secondary | ICD-10-CM

## 2023-09-08 DIAGNOSIS — R7301 Impaired fasting glucose: Secondary | ICD-10-CM

## 2023-09-08 DIAGNOSIS — E6609 Other obesity due to excess calories: Secondary | ICD-10-CM

## 2023-09-16 MED ORDER — WEGOVY 0.25 MG/0.5ML ~~LOC~~ SOAJ: 0.2500 mg | SUBCUTANEOUS | 0 refills | Status: DC

## 2023-09-16 NOTE — Telephone Encounter (Signed)
 LOV: 08/21/2023 Last refill: 08/21/2023

## 2023-09-17 ENCOUNTER — Telehealth: Payer: Self-pay

## 2023-09-17 ENCOUNTER — Other Ambulatory Visit (HOSPITAL_COMMUNITY): Payer: Self-pay

## 2023-09-17 NOTE — Telephone Encounter (Signed)
 Pharmacy Patient Advocate Encounter   Received notification from CoverMyMeds that prior authorization for Wegovy 0.25MG /0.5ML auto-injectors is required/requested.   Insurance verification completed.   The patient is insured through [facility] .   Per test claim: PA required; PA submitted to above mentioned insurance via CoverMyMeds Key/confirmation #/EOC [id number] Status is pending

## 2023-09-18 ENCOUNTER — Other Ambulatory Visit (HOSPITAL_COMMUNITY): Payer: Self-pay

## 2023-09-18 NOTE — Telephone Encounter (Signed)
 Pharmacy Patient Advocate Encounter  Received notification from [facility] that Prior Authorization for Wegovy 0.25MG /0.5ML auto-injectors has been DENIED.  Full denial letter will be uploaded to the media tab. See denial reason below.   PA #/Case ID/Reference #: [id number]

## 2023-09-24 NOTE — Telephone Encounter (Signed)
 Forwarding message below. Please advise.

## 2023-09-24 NOTE — Telephone Encounter (Signed)
 Copied from CRM [id number]. Topic: Clinical - Prescription Issue >> Sep 24, 2023 12:11 PM Melissa C wrote: Reason for CRM: patient called as she received denial letter for Semaglutide -Weight Management (WEGOVY ) 0.25 MG/0.5ML SOAJ. However, she was wondering if more information needs to be sent in as it states it needs her BMI from the past 45 days. Please advise with patient and/or insurance for prior authorization. Thank you.

## 2023-09-26 ENCOUNTER — Other Ambulatory Visit (HOSPITAL_COMMUNITY): Payer: Self-pay

## 2023-09-26 ENCOUNTER — Ambulatory Visit: Admitting: Nurse Practitioner

## 2023-09-26 ENCOUNTER — Telehealth: Payer: Self-pay | Admitting: Pharmacist

## 2023-09-26 ENCOUNTER — Encounter: Payer: Self-pay | Admitting: Nurse Practitioner

## 2023-09-26 VITALS — BP 128/80 | HR 84 | Temp 98.2°F | Ht 62.0 in | Wt 191.0 lb

## 2023-09-26 DIAGNOSIS — E6609 Other obesity due to excess calories: Secondary | ICD-10-CM

## 2023-09-26 DIAGNOSIS — E66811 Obesity, class 1: Secondary | ICD-10-CM

## 2023-09-26 DIAGNOSIS — Z6834 Body mass index (BMI) 34.0-34.9, adult: Secondary | ICD-10-CM

## 2023-09-26 NOTE — Telephone Encounter (Signed)
 Pharmacy Patient Advocate Encounter  Received notification from [facility] that the appeal for Wegovy  0.25MG /0.5ML auto-injectors has been APPROVED from 09/26/2023 to 04/03/2024   Thank you, Dene Fines, PharmD Clinical Pharmacist  [place]  Direct Dial: [phone]

## 2023-09-26 NOTE — Progress Notes (Signed)
                Established Patient Visit  Patient: Kristin Spencer   DOB: 1995-08-07   27 y.o. Female  MRN: [id number] Visit Date: 09/26/2023  Subjective:    Chief Complaint  Patient presents with   Follow-up    Weight loss management    HPI Obesity Scheduled appointment with nutrition Exercise: walking daily. Diet: increased intake of vegetables and lean protein, low sugar and carb No weight loss noted today Waiting for insurance to approve wegovy  Wt Readings from Last 3 Encounters:  09/26/23 191 lb (86.6 kg)  08/21/23 189 lb 3.2 oz (85.8 kg)  03/20/23 187 lb 9.6 oz (85.1 kg)    Advised to schedule f/up appointment once she starts wegovy .   Reviewed medical, surgical, and social history today  Medications: Outpatient Medications Prior to Visit  Medication Sig Note   Cetirizine HCl (ZYRTEC PO) Take by mouth.    FLUoxetine  (PROZAC ) 40 MG capsule Take 1 capsule (40 mg total) by mouth daily.    fluticasone  (FLONASE ) 50 MCG/ACT nasal spray 2 sprays per nostril once a day if needed for stuffy nose    ketoconazole  (NIZORAL ) 2 % shampoo Apply 1 Application topically 2 (two) times a week.    Semaglutide -Weight Management (WEGOVY ) 0.25 MG/0.5ML SOAJ Inject 0.25 mg into the skin once a week. (Patient not taking: Reported on 09/26/2023) 09/26/2023: Has not started yet    No facility-administered medications prior to visit.   Reviewed past medical and social history.   ROS per HPI above      Objective:  BP 128/80 (BP Location: Left Arm, Patient Position: Sitting, Cuff Size: Large)   Pulse 84   Temp 98.2 F (36.8 C) (Temporal)   Ht 5\' 2"  (1.575 m)   Wt 191 lb (86.6 kg)   LMP 09/25/2023   SpO2 99%   BMI 34.93 kg/m      Physical Exam Cardiovascular:     Rate and Rhythm: Normal rate.     Pulses: Normal pulses.  Pulmonary:     Effort: Pulmonary effort is normal.  Neurological:     Mental Status: She is alert and oriented to person, place, and time.      No results found for any visits on 09/26/23.    Assessment & Plan:    Problem List Items Addressed This Visit     Obesity - Primary   Scheduled appointment with nutrition Exercise: walking daily. Diet: increased intake of vegetables and lean protein, low sugar and carb No weight loss noted today Waiting for insurance to approve wegovy  Wt Readings from Last 3 Encounters:  09/26/23 191 lb (86.6 kg)  08/21/23 189 lb 3.2 oz (85.8 kg)  03/20/23 187 lb 9.6 oz (85.1 kg)    Advised to schedule f/up appointment once she starts wegovy .      Return in about 4 weeks (around 10/24/2023) for Weight management.     Kathrene Parents, NP

## 2023-09-26 NOTE — Telephone Encounter (Signed)
 Called and notified the patient that the Wegovy  0.25 mg/0.5mL auto-injectors have been approved by her insurance and that she may go to her pharmacy to pick it up. She thanked me for calling.

## 2023-09-26 NOTE — Patient Instructions (Signed)
 Schedule 61month f/up appointment once you get wegovy  pen. Maintain a heart helathy diet and daily exercise  How to Increase Your Level of Physical Activity Getting regular physical activity is important for your overall health and well-being. Most people do not get enough exercise. There are easy ways to increase your level of physical activity, even if you have not been very active in the past or if you are just starting out. What are the benefits of physical activity? Physical activity has many short-term and long-term benefits. Being active on a regular basis can improve your physical and mental health as well as provide other benefits. Physical health benefits Helping you lose weight or maintain a healthy weight. Strengthening your muscles and bones. Reducing your risk of certain long-term (chronic) diseases, including heart disease, cancer, and diabetes. Being able to move around more easily and for longer periods of time without getting tired (increased endurance or stamina). Improving your ability to fight off illness (enhanced immunity). Being able to sleep better. Helping you stay healthy as you get older, including: Helping you stay mobile, or capable of walking and moving around. Preventing accidents, such as falls. Increasing life expectancy. Mental health benefits Boosting your mood and improving your self-esteem. Lowering your chance of having mental health problems, such as depression or anxiety. Helping you feel good about your body. Other benefits Finding new sources of fun and enjoyment. Meeting new people who share a common interest. Before you begin If you have a chronic illness or have not been active for a while, check with your health care provider about how to get started. Ask your health care provider what activities are safe for you. Start out slowly. Walking or doing some simple chair exercises is a good place to start, especially if you have not been active  before or for a long time. Set goals that you can work toward. Ask your health care provider how much exercise is best for you. In general, most adults should: Do moderate-intensity exercise for at least 150 minutes each week (30 minutes on most days of the week) or vigorous exercise for at least 75 minutes each week, or a combination of these. Moderate-intensity exercise can include walking at a quick pace, biking, yoga, water aerobics, or gardening. Vigorous exercise involves activities that take more effort, such as jogging or running, playing sports, swimming laps, or jumping rope. Do strength exercises on at least 2 days each week. This can include weight lifting, body weight exercises, and resistance-band exercises. How to be more physically active Make a plan  Try to find activities that you enjoy. You are more likely to commit to an exercise routine if it does not feel like a chore. If you have bone or joint problems, choose low-impact exercises, like walking or swimming. Use these tips for being successful with an exercise plan: Find a workout partner for accountability. Join a group or class, such as an aerobics class, cycling class, or sports team. Make family time active. Go for a walk, bike, or swim. Include a variety of exercises each week. Consider using a fitness tracker, such as a mobile phone app or a device worn like a watch, that will count the number of steps you take each day. Many people strive to reach 10,000 steps a day. Find ways to be active in your daily routines Besides your formal exercise plans, you can find ways to do physical activity during your daily routines, such as: Walking or biking to work or  to the store. Taking the stairs instead of the elevator. Parking farther away from the door at work or at the store. Planning walking meetings. Walking around while you are on the phone. Where to find more information Centers for Disease Control and Prevention:  [employer]'s Council on Fitness, Sports & Nutrition: www.fitness.gov ChooseMyPlate: http://www.harvey.com/ Contact a health care provider if: You have headaches, muscle aches, or joint pain that is concerning. You feel dizzy or light-headed while exercising. You faint. You feel your heart skipping, racing, or fluttering. You have chest pain while exercising. Summary Exercise benefits your mind and body at any age, even if you are just starting out. If you have a chronic illness or have not been active for a while, check with your health care provider before increasing your physical activity. Choose activities that are safe and enjoyable for you. Ask your health care provider what activities are safe for you. Start slowly. Tell your health care provider if you have problems as you start to increase your activity level. This information is not intended to replace advice given to you by your health care provider. Make sure you discuss any questions you have with your health care provider. Document Revised: 09/16/2020 Document Reviewed: 09/16/2020 Elsevier Patient Education  2024 [employer].

## 2023-09-26 NOTE — Assessment & Plan Note (Addendum)
 Scheduled appointment with nutrition Exercise: walking daily. Diet: increased intake of vegetables and lean protein, low sugar and carb No weight loss noted today Waiting for insurance to approve wegovy  Wt Readings from Last 3 Encounters:  09/26/23 191 lb (86.6 kg)  08/21/23 189 lb 3.2 oz (85.8 kg)  03/20/23 187 lb 9.6 oz (85.1 kg)    Advised to schedule f/up appointment once she starts wegovy .

## 2023-09-27 ENCOUNTER — Telehealth: Payer: Self-pay

## 2023-09-27 ENCOUNTER — Other Ambulatory Visit (HOSPITAL_COMMUNITY): Payer: Self-pay

## 2023-09-27 NOTE — Telephone Encounter (Signed)
 Can you help with this? Copied from CRM [id number]. Topic: Clinical - Medication Question >> Sep 27, 2023 12:32 PM Allyne Areola wrote: Reason for CRM: Patient is calling because her pharmacy advised that her Semaglutide -Weight Management (WEGOVY ) 0.25 MG/0.5ML SOAJ is denied. She would like to know the status and if we sent the approval.

## 2023-09-30 NOTE — Telephone Encounter (Signed)
 Copied from CRM [id number]. Topic: Clinical - Medication Question >> Sep 30, 2023 12:52 PM [place] La N wrote: Reason for CRM: Patient is following up on her wegovy  medication she stated that it was approved an is wanting to know when dr will send the medication in?

## 2023-10-01 NOTE — Telephone Encounter (Signed)
 Called pharmacy and Rx did go through with the insurance for $4 and they will get it ready.   Patient notified VIA phone. Dm/cma

## 2023-10-04 ENCOUNTER — Ambulatory Visit

## 2023-10-09 ENCOUNTER — Other Ambulatory Visit: Payer: Self-pay | Admitting: Nurse Practitioner

## 2023-10-09 DIAGNOSIS — L210 Seborrhea capitis: Secondary | ICD-10-CM

## 2023-10-10 ENCOUNTER — Other Ambulatory Visit: Payer: Self-pay

## 2023-10-10 ENCOUNTER — Other Ambulatory Visit (HOSPITAL_COMMUNITY): Payer: Self-pay

## 2023-10-10 MED ORDER — KETOCONAZOLE 2 % EX SHAM
1.0000 | MEDICATED_SHAMPOO | CUTANEOUS | 0 refills | Status: DC
  Filled 2023-10-10 – 2023-12-30 (×2): qty 120, 30d supply, fill #0

## 2023-10-11 ENCOUNTER — Encounter: Payer: Self-pay | Admitting: Pharmacist

## 2023-10-11 ENCOUNTER — Other Ambulatory Visit: Payer: Self-pay

## 2023-10-11 ENCOUNTER — Ambulatory Visit

## 2023-10-16 ENCOUNTER — Other Ambulatory Visit: Payer: Self-pay

## 2023-11-01 ENCOUNTER — Encounter: Payer: Self-pay | Admitting: Nurse Practitioner

## 2023-11-01 ENCOUNTER — Ambulatory Visit: Admitting: Nurse Practitioner

## 2023-11-01 ENCOUNTER — Ambulatory Visit: Payer: Self-pay | Admitting: Nurse Practitioner

## 2023-11-01 VITALS — BP 109/82 | HR 73 | Temp 96.8°F | Ht 62.0 in | Wt 186.4 lb

## 2023-11-01 DIAGNOSIS — E6609 Other obesity due to excess calories: Secondary | ICD-10-CM

## 2023-11-01 DIAGNOSIS — E66811 Obesity, class 1: Secondary | ICD-10-CM | POA: Diagnosis not present

## 2023-11-01 DIAGNOSIS — E7800 Pure hypercholesterolemia, unspecified: Secondary | ICD-10-CM

## 2023-11-01 DIAGNOSIS — Z6834 Body mass index (BMI) 34.0-34.9, adult: Secondary | ICD-10-CM

## 2023-11-01 DIAGNOSIS — R7301 Impaired fasting glucose: Secondary | ICD-10-CM

## 2023-11-01 LAB — COMPREHENSIVE METABOLIC PANEL WITH GFR
ALT: 28 U/L (ref 0–35)
AST: 21 U/L (ref 0–37)
Albumin: 4.5 g/dL (ref 3.5–5.2)
Alkaline Phosphatase: 63 U/L (ref 39–117)
BUN: 11 mg/dL (ref 6–23)
CO2: 26 meq/L (ref 19–32)
Calcium: 9.3 mg/dL (ref 8.4–10.5)
Chloride: 104 meq/L (ref 96–112)
Creatinine, Ser: 0.67 mg/dL (ref 0.40–1.20)
GFR: 120.56 mL/min (ref >60.00)
Glucose, Bld: 83 mg/dL (ref 70–99)
Potassium: 4.2 meq/L (ref 3.5–5.1)
Sodium: 138 meq/L (ref 135–145)
Total Bilirubin: 0.3 mg/dL (ref 0.2–1.2)
Total Protein: 7.1 g/dL (ref 6.0–8.3)

## 2023-11-01 MED ORDER — WEGOVY 0.5 MG/0.5ML ~~LOC~~ SOAJ: 0.5000 mg | SUBCUTANEOUS | 0 refills | Status: DC

## 2023-11-01 NOTE — Assessment & Plan Note (Addendum)
 Lost 5lbs in last 4weeks. No adverse effects with wegovy  0.25mg  Decreased exercise in last 2weeks due to grief (death of friend) Diet: daily, high protein and fresh fruit, struggles with adequate vegetable consumption. Wt Readings from Last 3 Encounters:  11/01/23 186 lb 6.4 oz (84.6 kg)  09/26/23 191 lb (86.6 kg)  08/21/23 189 lb 3.2 oz (85.8 kg)    Repeat CMP Increase wegovy  dose to 0.5mg  weekly. Encouraged to maintain a mediterranean diet and resume daily exercise F/up in 65month

## 2023-11-01 NOTE — Progress Notes (Signed)
 Established Patient Visit  Patient: Kristin Spencer   DOB: January 30, 1996   27 y.o. Female  MRN: [id number] Visit Date: 11/01/2023  Subjective:     Chief Complaint  Patient presents with   Follow-up    Follow up for wegovy  says its going good   HPI Obesity Lost 5lbs in last 4weeks. No adverse effects with wegovy  0.25mg  Decreased exercise in last 2weeks due to grief (death of friend) Diet: daily, high protein and fresh fruit, struggles with adequate vegetable consumption. Wt Readings from Last 3 Encounters:  11/01/23 186 lb 6.4 oz (84.6 kg)  09/26/23 191 lb (86.6 kg)  08/21/23 189 lb 3.2 oz (85.8 kg)    Repeat CMP Increase wegovy  dose to 0.5mg  weekly. Encouraged to maintain a mediterranean diet and resume daily exercise F/up in 7month  Wt Readings from Last 3 Encounters:  11/01/23 186 lb 6.4 oz (84.6 kg)  09/26/23 191 lb (86.6 kg)  08/21/23 189 lb 3.2 oz (85.8 kg)    Reviewed medical, surgical, and social history today  Medications: Outpatient Medications Prior to Visit  Medication Sig   Cetirizine HCl (ZYRTEC PO) Take by mouth.   FLUoxetine  (PROZAC ) 40 MG capsule Take 1 capsule (40 mg total) by mouth daily.   fluticasone  (FLONASE ) 50 MCG/ACT nasal spray 2 sprays per nostril once a day if needed for stuffy nose   ketoconazole  (NIZORAL ) 2 % shampoo Apply 1 Application topically 2 (two) times a week.   [DISCONTINUED] Semaglutide -Weight Management (WEGOVY ) 0.25 MG/0.5ML SOAJ Inject 0.25 mg into the skin once a week.   No facility-administered medications prior to visit.   Reviewed past medical and social history.   ROS per HPI above      Objective:  BP 109/82 (BP Location: Right Arm, Patient Position: Sitting, Cuff Size: Normal)   Pulse 73   Temp (!) 96.8 F (36 C) (Temporal)   Ht 5\' 2"  (1.575 m)   Wt 186 lb 6.4 oz (84.6 kg)   LMP 10/20/2023 (Exact Date)   SpO2 99%   BMI 34.09 kg/m      Physical Exam Vitals and nursing note  reviewed.  Cardiovascular:     Rate and Rhythm: Normal rate.     Pulses: Normal pulses.     Heart sounds: Normal heart sounds.  Pulmonary:     Effort: Pulmonary effort is normal.  Neurological:     Mental Status: She is alert and oriented to person, place, and time.     No results found for any visits on 11/01/23.    Assessment & Plan:    Problem List Items Addressed This Visit     Hypercholesterolemia   Relevant Medications   Semaglutide -Weight Management (WEGOVY ) 0.5 MG/0.5ML SOAJ   Impaired fasting glucose   Relevant Medications   Semaglutide -Weight Management (WEGOVY ) 0.5 MG/0.5ML SOAJ   Obesity - Primary   Lost 5lbs in last 4weeks. No adverse effects with wegovy  0.25mg  Decreased exercise in last 2weeks due to grief (death of friend) Diet: daily, high protein and fresh fruit, struggles with adequate vegetable consumption. Wt Readings from Last 3 Encounters:  11/01/23 186 lb 6.4 oz (84.6 kg)  09/26/23 191 lb (86.6 kg)  08/21/23 189 lb 3.2 oz (85.8 kg)    Repeat CMP Increase wegovy  dose to 0.5mg  weekly. Encouraged to maintain a mediterranean diet and resume daily exercise F/up in 7month      Relevant Medications  Semaglutide -Weight Management (WEGOVY ) 0.5 MG/0.5ML SOAJ   Other Relevant Orders   Comprehensive metabolic panel with GFR   Return in about 4 weeks (around 11/29/2023) for Weight management.     Kathrene Parents, NP

## 2023-11-01 NOTE — Patient Instructions (Addendum)
 Go to lab Maintain at least 64oz of water daily Maintain daily exercise Schedule appointment with GYN  Mediterranean Diet A Mediterranean diet is based on the traditions of countries on the [employer]. It focuses on eating more: Fruits and vegetables. Whole grains, beans, nuts, and seeds. Heart-healthy fats. These are fats that are good for your heart. It involves eating less: Dairy. Meat and eggs. Processed foods with added sugar, salt, and fat. This type of diet can help prevent certain conditions. It can also improve outcomes if you have a long-term (chronic) disease, such as kidney or heart disease. What are tips for following this plan? Reading food labels Check packaged foods for: The serving size. For foods such as rice and pasta, the serving size is the amount of cooked product, not dry. The total fat. Avoid foods with saturated fat or trans fat. Added sugars, such as corn syrup. Shopping  Try to have a balanced diet. Buy a variety of foods, such as: Fresh fruits and vegetables. You may be able to get these from local farmers markets. You can also buy them frozen. Grains, beans, nuts, and seeds. Some of these can be bought in bulk. Fresh seafood. Poultry and eggs. Low-fat dairy products. Buy whole ingredients instead of foods that have already been packaged. If you can't get fresh seafood, buy precooked frozen shrimp or canned fish, such as tuna, salmon, or sardines. Stock your pantry so you always have certain foods on hand, such as olive oil, canned tuna, canned tomatoes, rice, pasta, and beans. Cooking Cook foods with extra-virgin olive oil instead of using butter or other vegetable oils. Have meat as a side dish. Have vegetables or grains as your main dish. This means having meat in small portions or adding small amounts of meat to foods like pasta or stew. Use beans or vegetables instead of meat in common dishes like chili or lasagna. Try out different cooking  methods. Try roasting, broiling, steaming, and sauting vegetables. Add frozen vegetables to soups, stews, pasta, or rice. Add nuts or seeds for added healthy fats and plant protein at each meal. You can add these to yogurt, salads, or vegetable dishes. Marinate fish or vegetables using olive oil, lemon juice, garlic, and fresh herbs. Meal planning Plan to eat a vegetarian meal one day each week. Try to work up to two vegetarian meals, if possible. Eat seafood two or more times a week. Have healthy snacks on hand. These may include: Vegetable sticks with hummus. Greek yogurt. Fruit and nut trail mix. Eat balanced meals. These should include: Fruit: 2-3 servings a day. Vegetables: 4-5 servings a day. Low-fat dairy: 2 servings a day. Fish, poultry, or lean meat: 1 serving a day. Beans and legumes: 2 or more servings a week. Nuts and seeds: 1-2 servings a day. Whole grains: 6-8 servings a day. Extra-virgin olive oil: 3-4 servings a day. Limit red meat and sweets to just a few servings a month. Lifestyle  Try to cook and eat meals with your family. Drink enough fluid to keep your pee (urine) pale yellow. Be active every day. This includes: Aerobic exercise, which is exercise that causes your heart to beat faster. Examples include running and swimming. Leisure activities like gardening, walking, or housework. Get 7-8 hours of sleep each night. Drink red wine if your provider says you can. A glass of wine is 5 oz (150 mL). You may be allowed to have: Up to 1 glass a day if you're female and not pregnant.  Up to 2 glasses a day if you're female. What foods should I eat? Fruits Apples. Apricots. Avocado. Berries. Bananas. Cherries. Dates. Figs. Grapes. Lemons. Melon. Oranges. Peaches. Plums. Pomegranate. Vegetables Artichokes. Beets. Broccoli. Cabbage. Carrots. Eggplant. Green beans. Chard. Kale. Spinach. Onions. Leeks. Peas. Squash. Tomatoes. Peppers. Radishes. Grains Whole-grain pasta.  Brown rice. Bulgur wheat. Polenta. Couscous. Whole-wheat bread. Dwyane Glad. Meats and other proteins Beans. Almonds. Sunflower seeds. Pine nuts. Peanuts. Cod. Salmon. Scallops. Shrimp. Tuna. Tilapia. Clams. Oysters. Eggs. Chicken or [place] without skin. Dairy Low-fat milk. Cheese. Greek yogurt. Fats and oils Extra-virgin olive oil. Avocado oil. Grapeseed oil. Beverages Water. Red wine. Herbal tea. Sweets and desserts Greek yogurt with honey. Baked apples. Poached pears. Trail mix. Seasonings and condiments Basil. Cilantro. Coriander. Cumin. Mint. Parsley. Sage. Rosemary. Tarragon. Garlic. Oregano. Thyme. Pepper. Balsamic vinegar. Tahini. Hummus. Tomato sauce. Olives. Mushrooms. The items listed above may not be all the foods and drinks you can have. Talk to a dietitian to learn more. What foods should I limit? This is a list of foods that should be eaten rarely. Fruits Fruit canned in syrup. Vegetables Deep-fried potatoes, like [place] fries. Grains Packaged pasta or rice dishes. Cereal with added sugar. Snacks with added sugar. Meats and other proteins Beef. Pork. Lamb. Chicken or [place] with skin. Hot dogs. Helene Loader. Dairy Ice cream. Sour cream. Whole milk. Fats and oils Butter. Canola oil. Vegetable oil. Beef fat (tallow). Lard. Beverages Juice. Sugar-sweetened soft drinks. Beer. Liquor and spirits. Sweets and desserts Cookies. Cakes. Pies. Candy. Seasonings and condiments Mayonnaise. Pre-made sauces and marinades. The items listed above may not be all the foods and drinks you should limit. Talk to a dietitian to learn more. Where to find more information American Heart Association (AHA): heart.org This information is not intended to replace advice given to you by your health care provider. Make sure you discuss any questions you have with your health care provider. Document Revised: 09/02/2022 Document Reviewed: 09/02/2022 Elsevier Patient Education  2024 [employer].

## 2023-11-06 ENCOUNTER — Ambulatory Visit: Admitting: Nurse Practitioner

## 2023-11-08 ENCOUNTER — Encounter: Payer: Self-pay | Admitting: Dietician

## 2023-11-08 ENCOUNTER — Encounter: Attending: Nurse Practitioner | Admitting: Dietician

## 2023-11-08 DIAGNOSIS — E7800 Pure hypercholesterolemia, unspecified: Secondary | ICD-10-CM | POA: Diagnosis not present

## 2023-11-08 NOTE — Progress Notes (Signed)
 Class 1 of the Lifestyle Change Program   Pt was seen on 11/08/23 for class 1 of 2 of a series of classes on proper nutrition for adults with acute or chronic diease's such as GI issues, Obesity, CKD stages 1-3, HTN, hyperlipidemia, previous Bariatric surgery, etc. The focus of this class series is how to make lifestyle changes, understanding macronutrients, micronutrients, MyPlate, snack creation and meal creation.   Upon completion of this series patients will have the tools to be able to manage their acute/chronic disease through education and diet manipulation:  Stages of Change: Patients readiness to make dietary changes is assessed using the Stages of Change model, which includes Precontemplation, Contemplation, Preparation, Action, and Maintenance. Tailored interventions are provided based on the Patients stage through education.  Calories, Macronutrients, and Micronutrients: Patient education focuses on understanding the role of calories, macronutrients (proteins, fats, and carbohydrates), and micronutrients (vitamins and minerals) in overall health and nutrition.  Balanced Plate Skill Development: Patient is guided in creating meals that incorporate a variety of food groups, emphasizing proper portion sizes and nutrient balance for optimal health.  Nutritious Snack Creation: The patient develops skills to prepare healthy, nutrient-dense snacks to improve energy levels and maintain nutrition balance throughout the day.  Personalized Nutrition Action Plan: A customized plan is developed, incorporating specific, measurable nutrition goals that align with the patients needs, focusing on sustainable behavior change.   Handouts given: SMART goals sheet Detailed MyPlate  Macronutrients and Micronutrients description  Balanced Snacks

## 2023-11-15 ENCOUNTER — Encounter: Attending: Nurse Practitioner | Admitting: Dietician

## 2023-11-15 ENCOUNTER — Encounter: Payer: Self-pay | Admitting: Dietician

## 2023-11-15 DIAGNOSIS — E669 Obesity, unspecified: Secondary | ICD-10-CM | POA: Diagnosis not present

## 2023-11-15 NOTE — Progress Notes (Signed)
 Class 2 of the Lifestyle Change Program   Pt was seen on 11/15/23 for class 2 of 2 of a series of classes on proper nutrition for adults with acute or chronic disease's such as GI issues, Obesity, CKD stages 1-3, HTN, hyperlipidemia, previous Bariatric surgery, etc. The focus of this class series is how to make lifestyle change, understanding ,macronutrients, micronutrients, MyPlate, snack creation and meal creation.   Upon completion of this series patients will have the tools to be able to manage their acute/chronic disease through education and diet manipulation:  Healthier Grocery Shopping Choices: Patient develops skills to make healthier food selections while grocery shopping by identifying nutritious options and avoiding ultra processed foods.  Reading Nutrition Labels: Patient learned how to accurately read and interpret nutrition facts labels on packaged food products to make informed dietary choices.  Psychological Effects of Stress: Patient gained an understanding of how stress impacts the body, including physical, emotional, and mental effects.  Stress Management Techniques: Patient was taught techniques to manage and reduce stress, such as deep breathing, relaxation exercises, and time management strategies to improve overall well-being.  Dehydration Awareness and Hydration Strategies: Client is educated on the signs of dehydration and is provided strategies to stay hydrated throughout the day to support optimal health.  Benefits of Regular Physical Activity: Client recognizes the positive effects of regular physical activity on mental, emotional, and physical health, and is encouraged to incorporate movement into daily routines.  Mindfulness Techniques: Patient learned simple mindfulness practices such as mindful eating, meditation, and body awareness to improve mental focus and emotional regulation.    Handouts given: Mindful Eating Mindful MyPlate  Should I Eat Sheet Healthy  Sleep Sheet Grocery Shopping Survey Sheet

## 2023-12-04 ENCOUNTER — Encounter: Payer: Self-pay | Admitting: Nurse Practitioner

## 2023-12-04 ENCOUNTER — Ambulatory Visit: Admitting: Nurse Practitioner

## 2023-12-04 VITALS — BP 114/78 | HR 73 | Temp 98.3°F | Ht 62.0 in | Wt 182.0 lb

## 2023-12-04 DIAGNOSIS — Z6833 Body mass index (BMI) 33.0-33.9, adult: Secondary | ICD-10-CM

## 2023-12-04 DIAGNOSIS — E6609 Other obesity due to excess calories: Secondary | ICD-10-CM | POA: Diagnosis not present

## 2023-12-04 DIAGNOSIS — E7800 Pure hypercholesterolemia, unspecified: Secondary | ICD-10-CM | POA: Diagnosis not present

## 2023-12-04 DIAGNOSIS — E66811 Obesity, class 1: Secondary | ICD-10-CM

## 2023-12-04 DIAGNOSIS — R7301 Impaired fasting glucose: Secondary | ICD-10-CM | POA: Diagnosis not present

## 2023-12-04 DIAGNOSIS — Z6834 Body mass index (BMI) 34.0-34.9, adult: Secondary | ICD-10-CM | POA: Diagnosis not present

## 2023-12-04 MED ORDER — WEGOVY 0.5 MG/0.5ML ~~LOC~~ SOAJ: 0.5000 mg | SUBCUTANEOUS | 0 refills | Status: DC

## 2023-12-04 NOTE — Patient Instructions (Addendum)
 Maintain at least 0.8g/kg/day of protein. Maintain daily exercise. Total of per week of moderate intensity exercise (cardio and weight training) Maintain med dose. Monitor your weight once a week in AM Call office for wegovy  dose increased if you notice weight gain  How to Increase Your Level of Physical Activity Getting regular physical activity is important for your overall health and well-being. Most people do not get enough exercise. There are easy ways to increase your level of physical activity, even if you have not been very active in the past or if you are just starting out. What are the benefits of physical activity? Physical activity has many short-term and long-term benefits. Being active on a regular basis can improve your physical and mental health as well as provide other benefits. Physical health benefits Helping you lose weight or maintain a healthy weight. Strengthening your muscles and bones. Reducing your risk of certain long-term (chronic) diseases, including heart disease, cancer, and diabetes. Being able to move around more easily and for longer periods of time without getting tired (increased endurance or stamina). Improving your ability to fight off illness (enhanced immunity). Being able to sleep better. Helping you stay healthy as you get older, including: Helping you stay mobile, or capable of walking and moving around. Preventing accidents, such as falls. Increasing life expectancy. Mental health benefits Boosting your mood and improving your self-esteem. Lowering your chance of having mental health problems, such as depression or anxiety. Helping you feel good about your body. Other benefits Finding new sources of fun and enjoyment. Meeting new people who share a common interest. Before you begin If you have a chronic illness or have not been active for a while, check with your health care provider about how to get started. Ask your health care  provider what activities are safe for you. Start out slowly. Walking or doing some simple chair exercises is a good place to start, especially if you have not been active before or for a long time. Set goals that you can work toward. Ask your health care provider how much exercise is best for you. In general, most adults should: Do moderate-intensity exercise for at least 150 minutes each week (30 minutes on most days of the week) or vigorous exercise for at least 75 minutes each week, or a combination of these. Moderate-intensity exercise can include walking at a quick pace, biking, yoga, water aerobics, or gardening. Vigorous exercise involves activities that take more effort, such as jogging or running, playing sports, swimming laps, or jumping rope. Do strength exercises on at least 2 days each week. This can include weight lifting, body weight exercises, and resistance-band exercises. How to be more physically active Make a plan  Try to find activities that you enjoy. You are more likely to commit to an exercise routine if it does not feel like a chore. If you have bone or joint problems, choose low-impact exercises, like walking or swimming. Use these tips for being successful with an exercise plan: Find a workout partner for accountability. Join a group or class, such as an aerobics class, cycling class, or sports team. Make family time active. Go for a walk, bike, or swim. Include a variety of exercises each week. Consider using a fitness tracker, such as a mobile phone app or a device worn like a watch, that will count the number of steps you take each day. Many people strive to reach 10,000 steps a day. Find ways to be active in your  daily routines Besides your formal exercise plans, you can find ways to do physical activity during your daily routines, such as: Walking or biking to work or to the store. Taking the stairs instead of the elevator. Parking farther away from the door at  work or at the store. Planning walking meetings. Walking around while you are on the phone. Where to find more information Centers for Disease Control and Prevention: [employer] on Fitness, Sports & Nutrition: www.fitness.gov ChooseMyPlate: http://www.harvey.com/ Contact a health care provider if: You have headaches, muscle aches, or joint pain that is concerning. You feel dizzy or light-headed while exercising. You faint. You feel your heart skipping, racing, or fluttering. You have chest pain while exercising. Summary Exercise benefits your mind and body at any age, even if you are just starting out. If you have a chronic illness or have not been active for a while, check with your health care provider before increasing your physical activity. Choose activities that are safe and enjoyable for you. Ask your health care provider what activities are safe for you. Start slowly. Tell your health care provider if you have problems as you start to increase your activity level. This information is not intended to replace advice given to you by your health care provider. Make sure you discuss any questions you have with your health care provider. Document Revised: 09/16/2020 Document Reviewed: 09/16/2020 Elsevier Patient Education  2024 [employer].

## 2023-12-04 NOTE — Progress Notes (Signed)
 Established Patient Visit  Patient: Kristin Spencer   DOB: 10-28-1995   28 y.o. Female  MRN: [id number] Visit Date: 12/04/2023  Subjective:    Chief Complaint  Patient presents with   Follow-up    Follow up for weight medication management    HPI Obesity Lost 4lbs in last 4weeks. Total weight loss in last 2months: 9lbs No adverse effects with wegovy  0.25mg  Exercise: walking daily Diet: daily, high protein and fresh fruit, struggles with adequate vegetable consumption. Had appointment with nutritionist. States it was helpful. Wt Readings from Last 3 Encounters:  12/04/23 182 lb (82.6 kg)  11/01/23 186 lb 6.4 oz (84.6 kg)  09/26/23 191 lb (86.6 kg)    Maintain at least 0.8g/kg/day of protein. Maintain daily exercise. Total of per week of moderate intensity exercise (cardio and weight training) Maintain med dose. Monitor your weight once a week in AM Call office for wegovy  dose increased if you notice weight gain F/up in 39month   Reviewed medical, surgical, and social history today  Medications: Outpatient Medications Prior to Visit  Medication Sig   Cetirizine HCl (ZYRTEC PO) Take by mouth.   FLUoxetine  (PROZAC ) 40 MG capsule Take 1 capsule (40 mg total) by mouth daily.   fluticasone  (FLONASE ) 50 MCG/ACT nasal spray 2 sprays per nostril once a day if needed for stuffy nose   ketoconazole  (NIZORAL ) 2 % shampoo Apply 1 Application topically 2 (two) times a week.   [DISCONTINUED] Semaglutide -Weight Management (WEGOVY ) 0.5 MG/0.5ML SOAJ Inject 0.5 mg into the skin once a week.   No facility-administered medications prior to visit.   Reviewed past medical and social history.   ROS per HPI above      Objective:  BP 114/78 (BP Location: Left Arm, Patient Position: Sitting, Cuff Size: Normal)   Pulse 73   Temp 98.3 F (36.8 C) (Oral)   Ht 5' 2 (1.575 m)   Wt 182 lb (82.6 kg)   LMP 11/12/2023   SpO2 99%   BMI 33.29 kg/m       Physical Exam Vitals and nursing note reviewed.  Cardiovascular:     Rate and Rhythm: Normal rate.     Pulses: Normal pulses.  Pulmonary:     Effort: Pulmonary effort is normal.  Neurological:     Mental Status: She is alert and oriented to person, place, and time.  Psychiatric:        Mood and Affect: Mood normal.        Behavior: Behavior normal.        Thought Content: Thought content normal.     No results found for any visits on 12/04/23.    Assessment & Plan:    Problem List Items Addressed This Visit     Hypercholesterolemia   Relevant Medications   Semaglutide -Weight Management (WEGOVY ) 0.5 MG/0.5ML SOAJ   Impaired fasting glucose   Relevant Medications   Semaglutide -Weight Management (WEGOVY ) 0.5 MG/0.5ML SOAJ   Obesity - Primary   Lost 4lbs in last 4weeks. Total weight loss in last 2months: 9lbs No adverse effects with wegovy  0.25mg  Exercise: walking daily Diet: daily, high protein and fresh fruit, struggles with adequate vegetable consumption. Had appointment with nutritionist. States it was helpful. Wt Readings from Last 3 Encounters:  12/04/23 182 lb (82.6 kg)  11/01/23 186 lb 6.4 oz (84.6 kg)  09/26/23 191 lb (86.6 kg)    Maintain at  least 0.8g/kg/day of protein. Maintain daily exercise. Total of per week of moderate intensity exercise (cardio and weight training) Maintain med dose. Monitor your weight once a week in AM Call office for wegovy  dose increased if you notice weight gain F/up in 6month      Relevant Medications   Semaglutide -Weight Management (WEGOVY ) 0.5 MG/0.5ML SOAJ   Return in about 4 weeks (around 01/01/2024) for Weight management.     Roselie Mood, NP

## 2023-12-04 NOTE — Assessment & Plan Note (Signed)
 Lost 4lbs in last 4weeks. Total weight loss in last 2months: 9lbs No adverse effects with wegovy  0.25mg  Exercise: walking daily Diet: daily, high protein and fresh fruit, struggles with adequate vegetable consumption. Had appointment with nutritionist. States it was helpful. Wt Readings from Last 3 Encounters:  12/04/23 182 lb (82.6 kg)  11/01/23 186 lb 6.4 oz (84.6 kg)  09/26/23 191 lb (86.6 kg)    Maintain at least 0.8g/kg/day of protein. Maintain daily exercise. Total of per week of moderate intensity exercise (cardio and weight training) Maintain med dose. Monitor your weight once a week in AM Call office for wegovy  dose increased if you notice weight gain F/up in 47month

## 2023-12-11 ENCOUNTER — Ambulatory Visit: Admitting: Nurse Practitioner

## 2023-12-30 ENCOUNTER — Other Ambulatory Visit (HOSPITAL_BASED_OUTPATIENT_CLINIC_OR_DEPARTMENT_OTHER): Payer: Self-pay

## 2023-12-30 ENCOUNTER — Other Ambulatory Visit: Payer: Self-pay

## 2024-01-01 ENCOUNTER — Ambulatory Visit: Admitting: Nurse Practitioner

## 2024-01-01 ENCOUNTER — Encounter: Payer: Self-pay | Admitting: Nurse Practitioner

## 2024-01-01 VITALS — BP 113/70 | HR 77 | Temp 98.0°F | Ht 62.0 in | Wt 176.0 lb

## 2024-01-01 DIAGNOSIS — E7800 Pure hypercholesterolemia, unspecified: Secondary | ICD-10-CM | POA: Diagnosis not present

## 2024-01-01 DIAGNOSIS — E66811 Obesity, class 1: Secondary | ICD-10-CM | POA: Diagnosis not present

## 2024-01-01 DIAGNOSIS — E6609 Other obesity due to excess calories: Secondary | ICD-10-CM

## 2024-01-01 DIAGNOSIS — Z6832 Body mass index (BMI) 32.0-32.9, adult: Secondary | ICD-10-CM | POA: Diagnosis not present

## 2024-01-01 MED ORDER — WEGOVY 1 MG/0.5ML ~~LOC~~ SOAJ: 1.0000 mg | SUBCUTANEOUS | 2 refills | Status: DC

## 2024-01-01 NOTE — Patient Instructions (Signed)
 Increase wegovy  dose to 1mg  weekly Maintain high protein, high fiber diet, daily exercise and adequate oral hydration.

## 2024-01-01 NOTE — Assessment & Plan Note (Addendum)
 Lost 6lbs in last 4weeks. Total weight loss in last 76months: 15lbs No adverse effects with wegovy  0.5mg  Exercise: walking daily and resistant training daly Diet: daily, high protein and high fiber diet Wt Readings from Last 3 Encounters:  01/01/24 176 lb (79.8 kg)  12/04/23 182 lb (82.6 kg)  11/01/23 186 lb 6.4 oz (84.6 kg)    Maintain increase wegovy  dose to 1mg  weekly Maintain high protein, high fiber diet, daily exercise and adequate oral hydration. F/up in 76month

## 2024-01-01 NOTE — Progress Notes (Signed)
 Established Patient Visit  Patient: Kristin Spencer   DOB: [birth year]   28 y.o. Female  MRN: [id number] Visit Date: 01/01/2024  Subjective:    Chief Complaint  Patient presents with   Follow-up    Follow up for wegovy    HPI Obesity Lost 6lbs in last 4weeks. Total weight loss in last 27months: 15lbs No adverse effects with wegovy  0.5mg  Exercise: walking daily and resistant training daly Diet: daily, high protein and high fiber diet Wt Readings from Last 3 Encounters:  01/01/24 176 lb (79.8 kg)  12/04/23 182 lb (82.6 kg)  11/01/23 186 lb 6.4 oz (84.6 kg)    Maintain increase wegovy  dose to 1mg  weekly Maintain high protein, high fiber diet, daily exercise and adequate oral hydration. F/up in 27month  Wt Readings from Last 3 Encounters:  01/01/24 176 lb (79.8 kg)  12/04/23 182 lb (82.6 kg)  11/01/23 186 lb 6.4 oz (84.6 kg)    Reviewed medical, surgical, and social history today  Medications: Outpatient Medications Prior to Visit  Medication Sig   Cetirizine HCl (ZYRTEC PO) Take by mouth.   FLUoxetine  (PROZAC ) 40 MG capsule Take 1 capsule (40 mg total) by mouth daily.   fluticasone  (FLONASE ) 50 MCG/ACT nasal spray 2 sprays per nostril once a day if needed for stuffy nose   ketoconazole  (NIZORAL ) 2 % shampoo Apply 1 Application topically 2 (two) times a week.   [DISCONTINUED] Semaglutide -Weight Management (WEGOVY ) 0.5 MG/0.5ML SOAJ Inject 0.5 mg into the skin once a week.   No facility-administered medications prior to visit.   Reviewed past medical and social history.   ROS per HPI above  Last metabolic panel Lab Results  Component Value Date   GLUCOSE 83 11/01/2023   NA 138 11/01/2023   K 4.2 11/01/2023   CL 104 11/01/2023   CO2 26 11/01/2023   BUN 11 11/01/2023   CREATININE 0.67 11/01/2023   GFR 120.56 11/01/2023   CALCIUM 9.3 11/01/2023   PROT 7.1 11/01/2023   ALBUMIN 4.5 11/01/2023   BILITOT 0.3 11/01/2023   ALKPHOS 63  11/01/2023   AST 21 11/01/2023   ALT 28 11/01/2023   Last lipids Lab Results  Component Value Date   CHOL 139 08/21/2023   HDL 52.40 08/21/2023   LDLCALC 74 08/21/2023   TRIG 59.0 08/21/2023   CHOLHDL 3 08/21/2023   Last hemoglobin A1c Lab Results  Component Value Date   HGBA1C 5.4 08/21/2023        Objective:  BP 113/70 (BP Location: Right Arm, Patient Position: Sitting, Cuff Size: Normal)   Pulse 77   Temp 98 F (36.7 C) (Temporal)   Ht 5' 2 (1.575 m)   Wt 176 lb (79.8 kg)   LMP 12/11/2023   SpO2 99%   BMI 32.19 kg/m      Physical Exam Vitals and nursing note reviewed.  Cardiovascular:     Rate and Rhythm: Normal rate.     Pulses: Normal pulses.  Pulmonary:     Effort: Pulmonary effort is normal.  Abdominal:     General: Bowel sounds are normal. There is no distension.     Palpations: Abdomen is soft.     Tenderness: There is no abdominal tenderness. There is no guarding.  Neurological:     Mental Status: She is alert and oriented to person, place, and time.     No results found for any visits on  01/01/24.    Assessment & Plan:    Problem List Items Addressed This Visit     Hypercholesterolemia   Relevant Medications   WEGOVY  1 MG/0.5ML SOAJ   Obesity - Primary   Lost 6lbs in last 4weeks. Total weight loss in last 27months: 15lbs No adverse effects with wegovy  0.5mg  Exercise: walking daily and resistant training daly Diet: daily, high protein and high fiber diet Wt Readings from Last 3 Encounters:  01/01/24 176 lb (79.8 kg)  12/04/23 182 lb (82.6 kg)  11/01/23 186 lb 6.4 oz (84.6 kg)    Maintain increase wegovy  dose to 1mg  weekly Maintain high protein, high fiber diet, daily exercise and adequate oral hydration. F/up in 27month      Relevant Medications   WEGOVY  1 MG/0.5ML SOAJ   Return in about 3 months (around 04/02/2024) for Weight management, hyperlipidemia (fasting).     Roselie Mood, NP

## 2024-02-13 ENCOUNTER — Encounter: Payer: Self-pay | Admitting: Nurse Practitioner

## 2024-02-13 ENCOUNTER — Ambulatory Visit (INDEPENDENT_AMBULATORY_CARE_PROVIDER_SITE_OTHER): Payer: Self-pay | Admitting: Nurse Practitioner

## 2024-02-13 ENCOUNTER — Other Ambulatory Visit (HOSPITAL_COMMUNITY)
Admission: RE | Admit: 2024-02-13 | Discharge: 2024-02-13 | Disposition: A | Discharge status: Home / Self Care | Source: Ambulatory Visit | Attending: Nurse Practitioner | Admitting: Nurse Practitioner

## 2024-02-13 VITALS — BP 112/68 | HR 88 | Ht 63.0 in | Wt 169.0 lb

## 2024-02-13 DIAGNOSIS — Z1331 Encounter for screening for depression: Secondary | ICD-10-CM

## 2024-02-13 DIAGNOSIS — N898 Other specified noninflammatory disorders of vagina: Secondary | ICD-10-CM | POA: Diagnosis not present

## 2024-02-13 DIAGNOSIS — Z113 Encounter for screening for infections with a predominantly sexual mode of transmission: Secondary | ICD-10-CM

## 2024-02-13 DIAGNOSIS — N760 Acute vaginitis: Secondary | ICD-10-CM | POA: Diagnosis not present

## 2024-02-13 DIAGNOSIS — B9689 Other specified bacterial agents as the cause of diseases classified elsewhere: Secondary | ICD-10-CM

## 2024-02-13 DIAGNOSIS — Z01419 Encounter for gynecological examination (general) (routine) without abnormal findings: Secondary | ICD-10-CM | POA: Diagnosis not present

## 2024-02-13 DIAGNOSIS — Z124 Encounter for screening for malignant neoplasm of cervix: Secondary | ICD-10-CM | POA: Insufficient documentation

## 2024-02-13 LAB — WET PREP FOR TRICH, YEAST, CLUE

## 2024-02-13 MED ORDER — METRONIDAZOLE 500 MG PO TABS: 500.0000 mg | ORAL_TABLET | Freq: Two times a day (BID) | ORAL | 0 refills | Status: DC

## 2024-02-13 NOTE — Progress Notes (Signed)
   Kristin Spencer [birth year] [id number]   History:  28 y.o. G0 presents for annual exam. Monthly cycles. Complains of vaginal odor. Normal pap history. Down 60 pounds. Has completed Gardasil series.   Gynecologic History Patient's last menstrual period was 02/01/2024 (exact date). Period Cycle (Days): 28 Period Duration (Days): 5 Period Pattern: Regular Menstrual Flow: Heavy, Moderate Menstrual Control: Maxi pad Menstrual Control Change Freq (Hours): 2 Dysmenorrhea: (!) Severe Dysmenorrhea Symptoms: Cramping, Nausea Contraception/Family planning: condoms Sexually active: Yes  Health Maintenance Last Pap: 11/14/2020. Results were: Normal Last mammogram: Not indicated Last colonoscopy: Not indicated  Last Dexa: Not indicated     02/13/2024    2:52 PM  Depression screen PHQ 2/9  Decreased Interest 0  Down, Depressed, Hopeless 0  PHQ - 2 Score 0     Past medical history, past surgical history, family history and social history were all reviewed and documented in the EPIC chart. Oncologist.   ROS:  A ROS was performed and pertinent positives and negatives are included.  Exam:  Vitals:   02/13/24 1447  BP: 112/68  Pulse: 88  SpO2: 99%  Weight: 169 lb (76.7 kg)  Height: 5' 3 (1.6 m)   Body mass index is 29.94 kg/m.  General appearance:  Normal Thyroid :  Symmetrical, normal in size, without palpable masses or nodularity. Respiratory  Auscultation:  Clear without wheezing or rhonchi Cardiovascular  Auscultation:  Regular rate, without rubs, murmurs or gallops  Edema/varicosities:  Not grossly evident Abdominal  Soft,nontender, without masses, guarding or rebound.  Liver/spleen:  No organomegaly noted  Hernia:  None appreciated  Skin  Inspection:  Grossly normal Breasts: Examined lying and sitting.   Right: Without masses, retractions, nipple discharge or axillary adenopathy.   Left: Without masses, retractions, nipple discharge or axillary  adenopathy. Pelvic: External genitalia:  no lesions              Urethra:  normal appearing urethra with no masses, tenderness or lesions              Bartholins and Skenes: normal                 Vagina: + odor and discharge              Cervix: no lesions Bimanual Exam:  Uterus:  no masses or tenderness              Adnexa: no mass, fullness, tenderness              Rectovaginal: Deferred              Anus:  normal, no lesions  Kristin Spencer, CMA present as chaperone.   Wet prep + clue cells (+ odor)  Assessment/Plan:  28 y.o. G0 for annual exam.   Well female exam with routine gynecological exam - Plan: Cytology - PAP( [place]). Education provided on SBEs, importance of preventative screenings, current guidelines, high calcium diet, regular exercise, and multivitamin daily.   Cervical cancer screening - Plan: Cytology - PAP( [place]). Normal pap history.   Screening examination for STD (sexually transmitted disease) - Plan: Cytology - PAP( [place]). GC/CT added to pap. Declines HIV/RPR.   Vaginal odor - Plan: WET PREP FOR TRICH, YEAST, CLUE. + BV  Bacterial vaginosis - Plan: metroNIDAZOLE  (FLAGYL ) 500 MG tablet BID x 7 days.   Return in about 1 year (around 02/12/2025) for Annual.   Kristin DELENA Shutter DNP, 3:10 PM 02/13/2024

## 2024-02-14 LAB — CYTOLOGY - PAP
Chlamydia: NEGATIVE
Comment: NEGATIVE
Comment: NORMAL
Diagnosis: NEGATIVE
Neisseria Gonorrhea: NEGATIVE

## 2024-02-17 ENCOUNTER — Ambulatory Visit: Payer: Self-pay | Admitting: Nurse Practitioner

## 2024-02-19 ENCOUNTER — Other Ambulatory Visit: Payer: Self-pay | Admitting: Nurse Practitioner

## 2024-02-19 DIAGNOSIS — B9689 Other specified bacterial agents as the cause of diseases classified elsewhere: Secondary | ICD-10-CM

## 2024-02-19 MED ORDER — METRONIDAZOLE 0.75 % VA GEL: 1.0000 | Freq: Every day | VAGINAL | 0 refills | Status: AC

## 2024-03-01 ENCOUNTER — Encounter: Payer: Self-pay | Admitting: Nurse Practitioner

## 2024-03-05 ENCOUNTER — Ambulatory Visit: Admitting: Nurse Practitioner

## 2024-03-05 ENCOUNTER — Encounter: Payer: Self-pay | Admitting: Nurse Practitioner

## 2024-03-05 VITALS — BP 118/74 | HR 86 | Ht 63.0 in | Wt 166.4 lb

## 2024-03-05 DIAGNOSIS — Z6829 Body mass index (BMI) 29.0-29.9, adult: Secondary | ICD-10-CM | POA: Diagnosis not present

## 2024-03-05 DIAGNOSIS — Z23 Encounter for immunization: Secondary | ICD-10-CM | POA: Diagnosis not present

## 2024-03-05 DIAGNOSIS — E663 Overweight: Secondary | ICD-10-CM

## 2024-03-05 NOTE — Assessment & Plan Note (Signed)
 Lost 10lbs in last 12weeks. Total weight loss in last 4months: 18lbs No adverse effects with wegovy  0.5mg  Exercise: walking daily and resistant training daly Diet: daily, high protein and high fiber diet Current BMI at 29 No longer able to afford GLP-1RA due to change in insurance coverage Wt Readings from Last 3 Encounters:  03/05/24 166 lb 6.4 oz (75.5 kg)  02/13/24 169 lb (76.7 kg)  01/01/24 176 lb (79.8 kg)    Insurance does not cover any weight loss medications at this time. We discussed importance of maintaining current lifestyle modifications and incorporating resistant training exercised 3x/week. F/up in 38month

## 2024-03-05 NOTE — Progress Notes (Signed)
 Established Patient Visit  Patient: Kristin Spencer   DOB: December 23, 1995   27 y.o. Female  MRN: [id number] Visit Date: 03/05/2024  Subjective:    Chief Complaint  Patient presents with   Weight Management Screening    Discuss Wegovy  due to changes with insurance  WANTS Flu vaccine    HPI Overweight (BMI 25.0-29.9) Lost 10lbs in last 12weeks. Total weight loss in last 4months: 18lbs No adverse effects with wegovy  0.5mg  Exercise: walking daily and resistant training daly Diet: daily, high protein and high fiber diet Current BMI at 29 No longer able to afford GLP-1RA due to change in insurance coverage Wt Readings from Last 3 Encounters:  03/05/24 166 lb 6.4 oz (75.5 kg)  02/13/24 169 lb (76.7 kg)  01/01/24 176 lb (79.8 kg)    Insurance does not cover any weight loss medications at this time. We discussed importance of maintaining current lifestyle modifications and incorporating resistant training exercised 3x/week. F/up in 70month  Reviewed medical, surgical, and social history today  Medications: Outpatient Medications Prior to Visit  Medication Sig   Cetirizine HCl (ZYRTEC PO) Take by mouth.   FLUoxetine  (PROZAC ) 40 MG capsule Take 1 capsule (40 mg total) by mouth daily.   fluticasone  (FLONASE ) 50 MCG/ACT nasal spray 2 sprays per nostril once a day if needed for stuffy nose   ketoconazole  (NIZORAL ) 2 % shampoo Apply 1 Application topically 2 (two) times a week.   WEGOVY  1 MG/0.5ML SOAJ Inject 1 mg into the skin once a week.   No facility-administered medications prior to visit.   Reviewed past medical and social history.   ROS per HPI above      Objective:  BP 118/74 (BP Location: Left Arm, Patient Position: Sitting, Cuff Size: Large)   Pulse 86   Ht 5' 3 (1.6 m)   Wt 166 lb 6.4 oz (75.5 kg)   LMP 02/26/2024 (Exact Date)   SpO2 96%   BMI 29.48 kg/m      Physical Exam Vitals and nursing note reviewed.  Cardiovascular:      Rate and Rhythm: Normal rate.     Pulses: Normal pulses.  Pulmonary:     Effort: Pulmonary effort is normal.  Neurological:     Mental Status: She is alert and oriented to person, place, and time.  Psychiatric:        Mood and Affect: Mood normal.        Behavior: Behavior normal.        Thought Content: Thought content normal.     No results found for any visits on 03/05/24.    Assessment & Plan:    Problem List Items Addressed This Visit     Overweight (BMI 25.0-29.9) - Primary   Lost 10lbs in last 12weeks. Total weight loss in last 4months: 18lbs No adverse effects with wegovy  0.5mg  Exercise: walking daily and resistant training daly Diet: daily, high protein and high fiber diet Current BMI at 29 No longer able to afford GLP-1RA due to change in insurance coverage Wt Readings from Last 3 Encounters:  03/05/24 166 lb 6.4 oz (75.5 kg)  02/13/24 169 lb (76.7 kg)  01/01/24 176 lb (79.8 kg)    Insurance does not cover any weight loss medications at this time. We discussed importance of maintaining current lifestyle modifications and incorporating resistant training exercised 3x/week. F/up in 70month      Other  Visit Diagnoses       Immunization due       Relevant Orders   Flu vaccine trivalent PF, 6mos and older(Flulaval,Afluria,Fluarix,Fluzone) (Completed)      Return in about 2 months (around 05/05/2024) for CPE (fasting).     Roselie Mood, NP

## 2024-03-05 NOTE — Patient Instructions (Signed)
 Maintain daily exercise (cardio and resistant training) and heart healthy diet Ok to take current injection every other week till complete.

## 2024-03-18 ENCOUNTER — Other Ambulatory Visit: Payer: Self-pay | Admitting: Nurse Practitioner

## 2024-03-18 ENCOUNTER — Ambulatory Visit: Admitting: Nurse Practitioner

## 2024-03-18 DIAGNOSIS — F3342 Major depressive disorder, recurrent, in full remission: Secondary | ICD-10-CM

## 2024-03-25 ENCOUNTER — Ambulatory Visit: Admitting: Nurse Practitioner

## 2024-05-04 ENCOUNTER — Ambulatory Visit (INDEPENDENT_AMBULATORY_CARE_PROVIDER_SITE_OTHER): Admitting: Nurse Practitioner

## 2024-05-04 ENCOUNTER — Encounter: Payer: Self-pay | Admitting: Nurse Practitioner

## 2024-05-04 VITALS — BP 114/78 | HR 90 | Temp 97.9°F | Ht 63.0 in | Wt 161.8 lb

## 2024-05-04 DIAGNOSIS — E7800 Pure hypercholesterolemia, unspecified: Secondary | ICD-10-CM

## 2024-05-04 DIAGNOSIS — F3342 Major depressive disorder, recurrent, in full remission: Secondary | ICD-10-CM

## 2024-05-04 DIAGNOSIS — L210 Seborrhea capitis: Secondary | ICD-10-CM | POA: Diagnosis not present

## 2024-05-04 MED ORDER — FLUOXETINE HCL 40 MG PO CAPS: 40.0000 mg | ORAL_CAPSULE | Freq: Every day | ORAL | 3 refills | Status: AC

## 2024-05-04 MED ORDER — KETOCONAZOLE 2 % EX SHAM: 1.0000 | MEDICATED_SHAMPOO | CUTANEOUS | 1 refills | Status: AC

## 2024-05-04 NOTE — Progress Notes (Signed)
 Complete physical exam  Patient: Kristin Spencer   DOB: 25-Jun-1995   28 y.o. Female  MRN: [id number] Visit Date: 05/04/2024  Subjective:    Chief Complaint  Patient presents with   Annual Exam    FASTING    Kristin Spencer is a 28 y.o. female who presents today for a complete physical exam. She reports consuming a low fat diet. Home exercise routine includes walking and weight training 2-3x/week. She generally feels well. She reports sleeping well. She does not have additional problems to discuss today.  Vision:No Dental:No STD Screen:No  BP Readings from Last 3 Encounters:  05/04/24 114/78  03/05/24 118/74  02/13/24 112/68   Wt Readings from Last 3 Encounters:  05/04/24 161 lb 12.8 oz (73.4 kg)  03/05/24 166 lb 6.4 oz (75.5 kg)  02/13/24 169 lb (76.7 kg)   Most recent fall risk assessment:    05/04/2024    1:11 PM  Fall Risk   Falls in the past year? 0  Number falls in past yr: 0  Injury with Fall? 0  Risk for fall due to : No Fall Risks  Follow up Falls evaluation completed   Depression screen:Yes - Depression  Most recent depression screenings:    05/04/2024    1:11 PM 03/05/2024    9:10 AM  PHQ 2/9 Scores  PHQ - 2 Score 1 1  PHQ- 9 Score 2 5      Data saved with a previous flowsheet row definition   HPI  No problem-specific Assessment & Plan notes found for this encounter.  Past Medical History:  Diagnosis Date   Anxiety    Cervical strain 11/03/2019   Depression    Follicular cyst of skin and subcutaneous tissue 08/12/2020   Hypercholesterolemia 02/20/2023   Strain of lumbar region 11/03/2019   Past Surgical History:  Procedure Laterality Date   WISDOM TOOTH EXTRACTION     Social History   Socioeconomic History   Marital status: Single    Spouse name: Not on file   Number of children: 0   Years of education: Not on file   Highest education level: Bachelor's degree (e.g., BA, AB, BS)  Occupational History   Not on file  Tobacco Use    Smoking status: Never   Smokeless tobacco: Never  Vaping Use   Vaping status: Never Used  Substance and Sexual Activity   Alcohol use: Yes    Comment: At least a glass of wine over one month   Drug use: Not Currently   Sexual activity: Yes    Birth control/protection: Condom  Other Topics Concern   Not on file  Social History Narrative   ** Merged History Encounter **       Social Drivers of Health   Financial Resource Strain: Low Risk  (05/03/2024)   Overall Financial Resource Strain (CARDIA)    Difficulty of Paying Living Expenses: Not hard at all  Food Insecurity: No Food Insecurity (05/03/2024)   Hunger Vital Sign    Worried About Running Out of Food in the Last Year: Never true    Ran Out of Food in the Last Year: Never true  Transportation Needs: No Transportation Needs (05/03/2024)   PRAPARE - Administrator, Civil Service (Medical): No    Lack of Transportation (Non-Medical): No  Physical Activity: Sufficiently Active (05/03/2024)   Exercise Vital Sign    Days of Exercise per Week: 6 days    Minutes of Exercise per Session:  60 min  Stress: No Stress Concern Present (05/03/2024)   [employer] of Occupational Health - Occupational Stress Questionnaire    Feeling of Stress: Only a little  Social Connections: Socially Isolated (05/03/2024)   Social Connection and Isolation Panel    Frequency of Communication with Friends and Family: More than three times a week    Frequency of Social Gatherings with Friends and Family: More than three times a week    Attends Religious Services: Never    Database Administrator or Organizations: No    Attends Engineer, Structural: Not on file    Marital Status: Never married  Intimate Partner Violence: Not on file   Family Status  Relation Name Status   Mother  Alive   Father Museum/gallery Exhibitions Officer Alive   Sister  Alive   Mat Aunt  (Not Specified)   Mat Aunt Moms Sister (Not Specified)   Pat Aunt Dad's Aunt (Not  Specified)   Pat Higinio Bridgeman (Not Specified)   Pat Uncle Sul (Not Specified)   MGF  (Not Specified)   PGM Deb (Not Specified)   PGF Noble (Not Specified)   Other paternal grand Aunt Deceased   Neg Hx  (Not Specified)  No partnership data on file   Family History  Problem Relation Age of Onset   Diabetes Father    Allergic rhinitis Father    Food Allergy Father    Allergic rhinitis Sister    Cancer Maternal Aunt 45       breast cancer   Cancer Paternal Aunt    Diabetes Paternal Uncle    Diabetes Paternal Uncle    Diabetes Maternal Grandfather    Diabetes Paternal Grandmother    Diabetes Paternal Grandfather    Heart disease Paternal Grandfather    Heart attack Paternal Grandfather    Cancer Other        Breast cancer   Angioedema Neg Hx    Asthma Neg Hx    Eczema Neg Hx    Immunodeficiency Neg Hx    Urticaria Neg Hx    Allergies  Allergen Reactions   Shellfish Allergy Nausea And Vomiting, Shortness Of Breath and Nausea Only    Patient Care Team: Sherian Valenza, Roselie Rockford, NP as PCP - General (Internal Medicine)   Medications: Outpatient Medications Prior to Visit  Medication Sig   Cetirizine HCl (ZYRTEC PO) Take by mouth.   fluticasone  (FLONASE ) 50 MCG/ACT nasal spray 2 sprays per nostril once a day if needed for stuffy nose   [DISCONTINUED] FLUoxetine  (PROZAC ) 40 MG capsule TAKE ONE CAPSULE BY MOUTH ONE TIME DAILY   [DISCONTINUED] ketoconazole  (NIZORAL ) 2 % shampoo Apply 1 Application topically 2 (two) times a week.   [DISCONTINUED] WEGOVY  1 MG/0.5ML SOAJ Inject 1 mg into the skin once a week. (Patient not taking: Reported on 05/04/2024)   No facility-administered medications prior to visit.    Review of Systems  Constitutional:  Negative for activity change, appetite change and unexpected weight change.  Respiratory: Negative.    Cardiovascular: Negative.   Gastrointestinal: Negative.   Endocrine: Negative for cold intolerance and heat intolerance.  Genitourinary:  Negative.   Musculoskeletal: Negative.   Skin: Negative.   Neurological: Negative.   Hematological: Negative.   Psychiatric/Behavioral:  Negative for behavioral problems, decreased concentration, dysphoric mood, hallucinations, self-injury, sleep disturbance and suicidal ideas. The patient is not nervous/anxious.     Last CBC Lab Results  Component Value Date   WBC 4.6 11/20/2021   HGB 13.2  11/20/2021   HCT 39.9 11/20/2021   MCV 81.1 11/20/2021   RDW 13.4 11/20/2021   PLT 257.0 11/20/2021   Last metabolic panel Lab Results  Component Value Date   GLUCOSE 83 11/01/2023   NA 138 11/01/2023   K 4.2 11/01/2023   CL 104 11/01/2023   CO2 26 11/01/2023   BUN 11 11/01/2023   CREATININE 0.67 11/01/2023   GFR 120.56 11/01/2023   CALCIUM 9.3 11/01/2023   PROT 7.1 11/01/2023   ALBUMIN 4.5 11/01/2023   BILITOT 0.3 11/01/2023   ALKPHOS 63 11/01/2023   AST 21 11/01/2023   ALT 28 11/01/2023   Last lipids Lab Results  Component Value Date   CHOL 139 08/21/2023   HDL 52.40 08/21/2023   LDLCALC 74 08/21/2023   TRIG 59.0 08/21/2023   CHOLHDL 3 08/21/2023      Objective:  BP 114/78 (BP Location: Left Arm, Patient Position: Sitting, Cuff Size: Large)   Pulse 90   Temp 97.9 F (36.6 C) (Oral)   Ht 5' 3 (1.6 m)   Wt 161 lb 12.8 oz (73.4 kg)   LMP 04/19/2024   SpO2 100%   BMI 28.66 kg/m     Physical Exam Vitals and nursing note reviewed.  Constitutional:      General: She is not in acute distress. HENT:     Right Ear: Tympanic membrane, ear canal and external ear normal.     Left Ear: Tympanic membrane, ear canal and external ear normal.     Nose: Nose normal.  Eyes:     Extraocular Movements: Extraocular movements intact.     Conjunctiva/sclera: Conjunctivae normal.     Pupils: Pupils are equal, round, and reactive to light.  Neck:     Thyroid : No thyroid  mass, thyromegaly or thyroid  tenderness.  Cardiovascular:     Rate and Rhythm: Normal rate and regular rhythm.      Pulses: Normal pulses.     Heart sounds: Normal heart sounds.  Pulmonary:     Effort: Pulmonary effort is normal.     Breath sounds: Normal breath sounds.  Abdominal:     General: Bowel sounds are normal.     Palpations: Abdomen is soft.  Musculoskeletal:        General: Normal range of motion.     Cervical back: Normal range of motion and neck supple.     Right lower leg: No edema.     Left lower leg: No edema.  Lymphadenopathy:     Cervical: No cervical adenopathy.  Skin:    General: Skin is warm and dry.  Neurological:     Mental Status: She is alert and oriented to person, place, and time.     Cranial Nerves: No cranial nerve deficit.  Psychiatric:        Mood and Affect: Mood normal.        Behavior: Behavior normal.        Thought Content: Thought content normal.     No results found for any visits on 05/04/24.    Assessment & Plan:    Routine Health Maintenance and Physical Exam  Immunization History  Administered Date(s) Administered   DTaP 07/27/1997, 10/06/1997, 02/22/1998, 05/03/2000, 06/02/2002   Dtap, Unspecified 02/22/1998   HIB (PRP-OMP) 07/27/1997, 10/06/1997   HIB (PRP-T) 07/27/1997, 10/06/1997, 07/19/1998   HPV 9-valent 10/07/2015, 11/14/2020, 05/16/2021   Hep B, Unspecified Jan 08, 1997, 05/31/1997, 02/22/1998   Hepatitis A 10/07/2015   Hepatitis A, Ped/Adol-2 Dose 10/07/2015   Hepatitis B 12/06/1996,  05/31/1997, 02/22/1998   Hepatitis B, PED/ADOLESCENT 01-Sep-1996, 05/31/1997, 02/22/1998   IPV 07/27/1997, 10/06/1997, 02/21/1998, 06/02/2002   Influenza Split 03/22/2015   Influenza, Seasonal, Injecte, Preservative Fre 02/20/2023, 03/05/2024   Influenza,inj,Quad PF,6+ Mos 03/22/2015, 05/23/2021, 03/13/2022   Influenza-Unspecified 05/23/2021, 03/19/2022   MMR 08/17/1998, 06/02/2002   Meningococcal Conjugate 09/22/2013, 10/07/2015   OPV 07/27/1997, 10/06/1997   PFIZER(Purple Top)SARS-COV-2 Vaccination 08/15/2019, 09/08/2019, 05/17/2020   Polio,  Unspecified 02/22/1998   Tdap 01/24/2009, 10/07/2015   Varicella 08/17/1998, 09/22/2013   Health Maintenance  Topic Date Due   COVID-19 Vaccine (4 - 2025-26 season) 06/03/2024 (Originally 02/03/2024)   DTaP/Tdap/Td (8 - Td or Tdap) 10/06/2025   Cervical Cancer Screening (Pap smear)  02/13/2027   Influenza Vaccine  Completed   HPV VACCINES  Completed   Hepatitis C Screening  Completed   HIV Screening  Completed   Pneumococcal Vaccine  Aged Out   Meningococcal B Vaccine  Aged Out   Hepatitis B Vaccines 19-59 Average Risk  Discontinued   Discussed health benefits of physical activity, and encouraged her to engage in regular exercise appropriate for her age and condition.  Problem List Items Addressed This Visit     Dandruff in adult   Relevant Medications   ketoconazole  (NIZORAL ) 2 % shampoo   Hypercholesterolemia - Primary   Recurrent major depressive disorder, in full remission   Relevant Medications   FLUoxetine  (PROZAC ) 40 MG capsule   Return in about 1 year (around 05/04/2025) for depression and anxiety, CPE (fasting).     Roselie Mood, NP

## 2024-05-04 NOTE — Patient Instructions (Signed)
 Maintain Heart healthy diet and daily exercise. Maintain current medications.

## 2024-05-25 ENCOUNTER — Telehealth: Admitting: Physician Assistant

## 2024-05-25 DIAGNOSIS — R112 Nausea with vomiting, unspecified: Secondary | ICD-10-CM

## 2024-05-25 DIAGNOSIS — R6889 Other general symptoms and signs: Secondary | ICD-10-CM

## 2024-05-25 MED ORDER — ONDANSETRON 4 MG PO TBDP: 4.0000 mg | ORAL_TABLET | Freq: Three times a day (TID) | ORAL | 0 refills | Status: DC | PRN

## 2024-05-25 NOTE — Progress Notes (Signed)
 E visit for Flu like symptoms   We are sorry that you are not feeling well.  Here is how we plan to help! Based on what you have shared with me it looks like you may have a respiratory virus that may be influenza. I want you to get and take a home Flu/COVID test from the pharmacy as a precaution.   Influenza or the flu is  an infection caused by a respiratory virus. The flu virus is highly contagious and persons who did not receive their yearly flu vaccination may catch the flu from close contact.  For nasal congestion, you may use an oral decongestant such as Mucinex D or if you have glaucoma or high blood pressure use plain Mucinex.  Saline nasal spray or nasal drops can help and can safely be used as often as needed for congestion.  If you have a sore or scratchy throat, use a saltwater gargle-  to  teaspoon of salt dissolved in a 4-ounce to 8-ounce glass of warm water.  Gargle the solution for approximately 15-30 seconds and then spit.  It is important not to swallow the solution.  You can also use throat lozenges/cough drops and Chloraseptic spray to help with throat pain or discomfort.  Warm or cold liquids can also be helpful in relieving throat pain.  For headache, pain or general discomfort, you can use Ibuprofen  or Tylenol  as directed.   Some authorities believe that zinc sprays or the use of Echinacea may shorten the course of your symptoms.  I have prescribed the following medications to help lessen symptoms: I have prescribed Zofran  4 mg tablets 1 every 6 hours if needed for nausea  You are to isolate at home until you have been fever-free for at least 24 hours without a fever-reducing medication, and symptoms have been steadily improving for 24 hours.  If you must be around other household members who do not have symptoms, you need to make sure that both you and the family members are masking consistently with a high-quality mask.  If you note any worsening of symptoms despite  treatment, please seek an in-person evaluation ASAP. If you note any significant shortness of breath or any chest pain, please seek ED evaluation. Please do not delay care!  ANYONE WHO HAS FLU SYMPTOMS SHOULD: Stay home. The flu is highly contagious and going out or to work exposes others! Be sure to drink plenty of fluids. Water is fine as well as fruit juices, sodas and electrolyte beverages. You may want to stay away from caffeine or alcohol. If you are nauseated, try taking small sips of liquids. How do you know if you are getting enough fluid? Your urine should be a pale yellow or almost colorless. Get rest. Taking a steamy shower or using a humidifier may help nasal congestion and ease sore throat pain. Using a saline nasal spray works much the same way. Cough drops, hard candies and sore throat lozenges may ease your cough. Line up a caregiver. Have someone check on you regularly.  GET HELP RIGHT AWAY IF: You cannot keep down liquids or your medications. You become short of breath Your fell like you are going to pass out or loose consciousness. Your symptoms persist after you have completed your treatment plan  MAKE SURE YOU  Understand these instructions. Will watch your condition. Will get help right away if you are not doing well or get worse.  Your e-visit answers were reviewed by a board certified advanced clinical  practitioner to complete your personal care plan.  Depending on the condition, your plan could have included both over the counter or prescription medications.  If there is a problem please reply  once you have received a response from your provider.  Your safety is important to us .  If you have drug allergies check your prescription carefully.    You can use MyChart to ask questions about todays visit, request a non-urgent call back, or ask for a work or school excuse for 24 hours related to this e-Visit. If it has been greater than 24 hours you will need to follow  up with your provider, or enter a new e-Visit to address those concerns.  You will get an e-mail in the next two days asking about your experience.  I hope that your e-visit has been valuable and will speed your recovery. Thank you for using e-visits.   I have spent 5 minutes in review of e-visit questionnaire, review and updating patient chart, medical decision making and response to patient.   Elsie Velma Lunger, PA-C

## 2024-05-25 NOTE — Progress Notes (Signed)
 Message sent to patient requesting further input regarding current symptoms. Awaiting patient response.

## 2024-06-10 ENCOUNTER — Ambulatory Visit: Admitting: Nurse Practitioner

## 2024-06-12 ENCOUNTER — Encounter: Payer: Self-pay | Admitting: Nurse Practitioner

## 2024-06-12 ENCOUNTER — Ambulatory Visit: Admitting: Nurse Practitioner

## 2024-06-12 VITALS — BP 104/68 | HR 87

## 2024-06-12 DIAGNOSIS — N926 Irregular menstruation, unspecified: Secondary | ICD-10-CM | POA: Diagnosis not present

## 2024-06-12 DIAGNOSIS — N760 Acute vaginitis: Secondary | ICD-10-CM | POA: Diagnosis not present

## 2024-06-12 DIAGNOSIS — B9689 Other specified bacterial agents as the cause of diseases classified elsewhere: Secondary | ICD-10-CM

## 2024-06-12 DIAGNOSIS — N898 Other specified noninflammatory disorders of vagina: Secondary | ICD-10-CM

## 2024-06-12 LAB — WET PREP FOR TRICH, YEAST, CLUE

## 2024-06-12 LAB — TSH: TSH: 1.2 m[IU]/L

## 2024-06-12 MED ORDER — METRONIDAZOLE 500 MG PO TABS: 500.0000 mg | ORAL_TABLET | Freq: Two times a day (BID) | ORAL | 0 refills | Status: DC

## 2024-06-12 NOTE — Progress Notes (Signed)
" ° °  Acute Office Visit  Subjective:    Patient ID: Kristin Spencer, female    DOB: 02/21/1996, 29 y.o.   MRN: [id number]   HPI 29 y.o. presents today for vaginal itching. Also having menses every 21 days. Was on birth control for years prior so unsure of what her normal is. Bleeding is better without birth control though. Declines STD screening.  Patient's last menstrual period was 06/08/2024 (exact date). Period Duration (Days): 4-5 Period Pattern: Regular (every 3 weeks) Menstrual Flow:  (heavy for 1 day & then goes to light) Menstrual Control: Maxi pad Dysmenorrhea: (!) Moderate Dysmenorrhea Symptoms: Cramping, Diarrhea, Nausea  Review of Systems  Constitutional: Negative.   Genitourinary:  Positive for menstrual problem, vaginal discharge and vaginal pain (Itching).       Objective:    Physical Exam Exam conducted with a chaperone present.  Constitutional:      Appearance: Normal appearance.  Genitourinary:    General: Normal vulva.     Vagina: Vaginal discharge present. No erythema.     Cervix: Normal.     BP 104/68   Pulse 87   LMP 06/08/2024 (Exact Date)   SpO2 99%  Wt Readings from Last 3 Encounters:  05/04/24 161 lb 12.8 oz (73.4 kg)  03/05/24 166 lb 6.4 oz (75.5 kg)  02/13/24 169 lb (76.7 kg)        Zada Louder, CMA present as biomedical engineer.   Wet prep + clue cells (+ odor)  Assessment & Plan:   Problem List Items Addressed This Visit   None Visit Diagnoses       Bacterial vaginosis    -  Primary   Relevant Medications   metroNIDAZOLE  (FLAGYL ) 500 MG tablet     Vaginal itching       Relevant Orders   WET PREP FOR TRICH, YEAST, CLUE     Menstrual changes       Relevant Orders   TSH      Plan: Flagyl  500 mg BID x 7 days. TSH pending. Reassured normal cycle 21-35 days. If cycles continue to get shorter she will reach out.    Return if symptoms worsen or fail to improve.    [name] DNP, 8:49 AM 06/12/2024 "

## 2024-06-13 ENCOUNTER — Encounter: Payer: Self-pay | Admitting: Nurse Practitioner

## 2024-06-15 ENCOUNTER — Ambulatory Visit: Payer: Self-pay | Admitting: Nurse Practitioner

## 2024-06-15 ENCOUNTER — Other Ambulatory Visit: Payer: Self-pay | Admitting: Nurse Practitioner

## 2024-06-15 DIAGNOSIS — B9689 Other specified bacterial agents as the cause of diseases classified elsewhere: Secondary | ICD-10-CM

## 2024-06-15 MED ORDER — METRONIDAZOLE 0.75 % VA GEL: 1.0000 | Freq: Every day | VAGINAL | 0 refills | Status: AC

## 2024-06-15 NOTE — Telephone Encounter (Signed)
 Message sent to patient this morning through results. Metrogel  prescribed.

## 2024-06-25 ENCOUNTER — Ambulatory Visit: Admitting: Internal Medicine

## 2024-06-25 ENCOUNTER — Encounter: Payer: Self-pay | Admitting: Internal Medicine

## 2024-06-25 VITALS — BP 114/72 | HR 90 | Temp 98.4°F | Ht 63.0 in | Wt 168.4 lb

## 2024-06-25 DIAGNOSIS — J358 Other chronic diseases of tonsils and adenoids: Secondary | ICD-10-CM | POA: Diagnosis not present

## 2024-06-25 LAB — POCT RAPID STREP A (OFFICE): Rapid Strep A Screen: NEGATIVE

## 2024-06-25 NOTE — Progress Notes (Signed)
 " [facility] [address] [place] [address] Dept: [phone] Dept Fax: [phone]  Acute Care Office Visit  Subjective:   Kristin Spencer 08-30-1995 06/25/2024  Chief Complaint  Patient presents with   Oral Swelling    Tonsil stones, irritated      HPI:  Discussed the use of AI scribe software for clinical note transcription with the patient, who gave verbal consent to proceed.  History of Present Illness   Kristin Spencer is a 29 year old female who presents with tonsil stones and sinus congestion.  She has experienced tonsil stones for at least a year, with the most recent stone being expelled a few days ago. She suspects another stone is present at the top of her left tonsil, which feels swollen. She experiences difficulty removing the stone due to a strong gag reflex. The swelling has persisted for weeks to months, but she has no throat pain.  She reports sinus congestion on the left side, which began one to two days ago. No fever or earache, but she notes nasal congestion and sinus issues.   The following portions of the patient's history were reviewed and updated as appropriate: past medical history, past surgical history, family history, social history, allergies, medications, and problem list.   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   Past Medical History:  Diagnosis Date   Anxiety    Cervical strain 11/03/2019    Depression    Follicular cyst of skin and subcutaneous tissue 08/12/2020   Hypercholesterolemia 02/20/2023   Neuromuscular disorder (HCC)    Strain of lumbar region 11/03/2019   Past Surgical History:  Procedure Laterality Date   WISDOM TOOTH EXTRACTION     Family History  Problem Relation Age of Onset   Diabetes Father    Allergic rhinitis Father    Food Allergy Father    Allergic rhinitis Sister    Cancer Maternal Aunt 45       breast cancer   Cancer Paternal Aunt    Diabetes Paternal Uncle    Diabetes Paternal Uncle    Diabetes Maternal Grandfather    Diabetes Paternal Grandmother    Diabetes Paternal Grandfather    Heart disease Paternal Grandfather    Heart attack Paternal Grandfather    Cancer Other        Breast cancer   Angioedema Neg Hx    Asthma Neg Hx    Eczema Neg Hx    Immunodeficiency Neg Hx    Urticaria Neg Hx    Current Medications[1] Allergies[2]   ROS: A complete ROS was performed with pertinent positives/negatives noted in the HPI. The remainder of the ROS are negative.    Objective:   Today's Vitals   06/25/24 1040  BP: 114/72  Pulse: 90  Temp: 98.4 F (36.9 C)  TempSrc: Temporal  Weight: 168 lb 6.4 oz (76.4 kg)  Height: 5' 3 (1.6 m)    GENERAL: Well-appearing, in NAD. Well nourished.  SKIN: Pink, warm and dry. No rash.  HEENT:  HEAD: Normocephalic, non-traumatic.  EYES: Conjunctive pink without exudate. PERRL.  EARS: External ear w/o redness, swelling, masses, or lesions. EAC clear. TM's intact, translucent w/o bulging, appropriate landmarks visualized.  NOSE: Septum midline w/o deformity. Nares patent, mucosa pink and non-inflamed w/o drainage. No sinus tenderness.  THROAT: Uvula midline. Oropharynx clear. Left tonsil 1 +, without redness,  with 1 area of localized purulence present. Mucus membranes pink and moist.  NECK: Trachea midline. Full ROM w/o pain or tenderness. No lymphadenopathy.  RESPIRATORY: Chest wall symmetrical.  Respirations even and non-labored. Breath sounds clear to auscultation bilaterally.  CARDIAC: S1, S2 present, regular rate and rhythm. Peripheral pulses 2+ bilaterally.  EXTREMITIES: Without clubbing, cyanosis, or edema.  NEUROLOGIC: Steady, even gait.  PSYCH/MENTAL STATUS: Alert, oriented x 3. Cooperative, appropriate mood and affect.    Results for orders placed or performed in visit on 06/25/24  POCT rapid strep A  Result Value Ref Range   Rapid Strep A Screen Negative Negative      Assessment & Plan:  Assessment and Plan    Tonsil stone Chronic tonsil stones with recent exacerbation on the left side. Negative strep test.  - Advised warm salt water gargles. - Recommended Tylenol  for discomfort. - Will discuss potential ENT referral if recurrent stones persist.      No orders of the defined types were placed in this encounter.  Orders Placed This Encounter  Procedures   POCT rapid strep A   Lab Orders         POCT rapid strep A     No images are attached to the encounter or orders placed in the encounter.  Return if symptoms worsen or fail to improve.   Rosina Senters, FNP     [1]  Current Outpatient Medications:    Cetirizine HCl (ZYRTEC PO), Take by mouth., Disp: , Rfl:    FLUoxetine  (PROZAC ) 40 MG capsule, Take 1 capsule (40 mg total) by mouth daily., Disp: 90 capsule, Rfl: 3   fluticasone  (FLONASE ) 50 MCG/ACT nasal spray, 2 sprays per nostril once a day if needed for stuffy nose, Disp: 16 g, Rfl: 5   ketoconazole  (NIZORAL ) 2 % shampoo, Apply 1 Application topically 2 (two) times a week., Disp: 120 mL, Rfl: 1 [2]  Allergies Allergen Reactions   Shellfish Allergy Nausea And Vomiting, Shortness Of Breath and Nausea Only   Flagyl  [Metronidazole ] Nausea Only    Body aches, headache   "

## 2024-06-25 NOTE — Patient Instructions (Signed)
 Warm salt water gargles  Tylenol  as needed for pain  If you continue to have recurrent stones, please notify PCP . May need referral to ENT.

## 2024-07-04 ENCOUNTER — Encounter: Payer: Self-pay | Admitting: Nurse Practitioner

## 2024-07-07 ENCOUNTER — Encounter: Payer: Self-pay | Admitting: Nurse Practitioner

## 2024-07-07 ENCOUNTER — Telehealth: Admitting: Nurse Practitioner

## 2024-07-07 VITALS — Wt 164.0 lb

## 2024-07-07 DIAGNOSIS — R7301 Impaired fasting glucose: Secondary | ICD-10-CM

## 2024-07-07 DIAGNOSIS — E7800 Pure hypercholesterolemia, unspecified: Secondary | ICD-10-CM

## 2024-07-07 DIAGNOSIS — F3342 Major depressive disorder, recurrent, in full remission: Secondary | ICD-10-CM

## 2024-07-07 DIAGNOSIS — Z8639 Personal history of other endocrine, nutritional and metabolic disease: Secondary | ICD-10-CM

## 2024-07-07 DIAGNOSIS — E663 Overweight: Secondary | ICD-10-CM

## 2024-07-07 DIAGNOSIS — F411 Generalized anxiety disorder: Secondary | ICD-10-CM

## 2024-07-07 MED ORDER — ZEPBOUND 2.5 MG/0.5ML ~~LOC~~ SOAJ: 2.5000 mg | SUBCUTANEOUS | 0 refills | Status: AC

## 2024-07-07 NOTE — Assessment & Plan Note (Signed)
Stable Sessions with psychologist  Maintain fluoxetine dose 

## 2024-07-07 NOTE — Assessment & Plan Note (Addendum)
 Repeat lipid panel Advised to maintain heart healthy diet

## 2024-07-07 NOTE — Assessment & Plan Note (Addendum)
 She wishes to resume GLP-1RA injection. Med discontinued last year due to change in insurance coverage. Patient has not been able to attain 5% weight loss in the past despite the following lifestyle modifications x 6months without success: caloric restrictions and increased physical exercise. She modifications were also made based on recommendations from a licensed nutritionist in 2024. She is unable to use Qsymia and Benzphetamine due to risk of worsening mood. She has a hx of GAD and Major Depressive disorder. Her mood is stable with the use of fluoxetine  at this time. In 2024, she lost 19lbs with use of zepbound  x 6months. In 2025, she lost 25lbs with the use of wegovy  x 6months. She has current maintained a heart healthy diet (high protein, high fiber, 2-37meals daily) and daily exercise regimen (walking and weight training) since discontinuation on wegovy  in 3months ago Wt Readings from Last 3 Encounters:  07/07/24 164 lb (74.4 kg)  06/25/24 168 lb 6.4 oz (76.4 kg)  05/04/24 161 lb 12.8 oz (73.4 kg)  Beginning BMI at 36.85 in 2024 current BMI is 29.05 due to use of GLP-1RA in 2024 and 2025. It is necessary to resume a pharmacotherapy at this point, while maintaining the above lifestyle modifications; in order to prevent the complications of obesity. She has no contraindications for using a GLP-1RA and has been made aware of the potential side effects. Sent zepbound  2.5mg  Prescription Encouraged to maintain healthy diet and daily exercise Check lipid panel and CMP F/up 23month after start injection

## 2024-07-08 ENCOUNTER — Other Ambulatory Visit

## 2024-07-08 DIAGNOSIS — R7301 Impaired fasting glucose: Secondary | ICD-10-CM

## 2024-07-08 DIAGNOSIS — F3342 Major depressive disorder, recurrent, in full remission: Secondary | ICD-10-CM

## 2024-07-08 DIAGNOSIS — Z8639 Personal history of other endocrine, nutritional and metabolic disease: Secondary | ICD-10-CM

## 2024-07-08 DIAGNOSIS — E663 Overweight: Secondary | ICD-10-CM

## 2024-07-08 DIAGNOSIS — E7800 Pure hypercholesterolemia, unspecified: Secondary | ICD-10-CM

## 2024-07-08 DIAGNOSIS — F411 Generalized anxiety disorder: Secondary | ICD-10-CM

## 2024-07-08 LAB — COMPREHENSIVE METABOLIC PANEL WITH GFR
ALT: 17 U/L (ref 3–35)
AST: 19 U/L (ref 5–37)
Albumin: 4.4 g/dL (ref 3.5–5.2)
Alkaline Phosphatase: 63 U/L (ref 39–117)
BUN: 8 mg/dL (ref 6–23)
CO2: 27 meq/L (ref 19–32)
Calcium: 9 mg/dL (ref 8.4–10.5)
Chloride: 105 meq/L (ref 96–112)
Creatinine, Ser: 0.62 mg/dL (ref 0.40–1.20)
GFR: 122.24 mL/min
Glucose, Bld: 84 mg/dL (ref 70–99)
Potassium: 3.9 meq/L (ref 3.5–5.1)
Sodium: 139 meq/L (ref 135–145)
Total Bilirubin: 0.4 mg/dL (ref 0.2–1.2)
Total Protein: 7 g/dL (ref 6.0–8.3)

## 2024-07-08 LAB — LIPID PANEL
Cholesterol: 125 mg/dL (ref 28–200)
HDL: 43.8 mg/dL
LDL Cholesterol: 63 mg/dL (ref 10–99)
NonHDL: 81.34
Total CHOL/HDL Ratio: 3
Triglycerides: 92 mg/dL (ref 10.0–149.0)
VLDL: 18.4 mg/dL (ref 0.0–40.0)

## 2024-07-09 ENCOUNTER — Ambulatory Visit: Payer: Self-pay | Admitting: Nurse Practitioner

## 2024-08-07 ENCOUNTER — Ambulatory Visit: Admitting: Nurse Practitioner

## 2025-05-05 ENCOUNTER — Encounter: Admitting: Nurse Practitioner
